# Patient Record
Sex: Female | Born: 1990 | Race: Black or African American | Hispanic: No | Marital: Single | Smoking: Former smoker
Health system: Southern US, Community
[De-identification: ages and names within clinical notes are randomized; demographics above are authoritative.]

## PROBLEM LIST (undated history)

## (undated) ENCOUNTER — Inpatient Hospital Stay (HOSPITAL_COMMUNITY): Payer: Self-pay

## (undated) DIAGNOSIS — Z789 Other specified health status: Secondary | ICD-10-CM

## (undated) HISTORY — PX: NO PAST SURGERIES: SHX2092

---

## 2012-06-30 ENCOUNTER — Encounter (HOSPITAL_COMMUNITY): Payer: Self-pay | Admitting: Emergency Medicine

## 2012-06-30 ENCOUNTER — Emergency Department (HOSPITAL_COMMUNITY)
Admission: EM | Admit: 2012-06-30 | Discharge: 2012-06-30 | Disposition: A | Discharge status: Home / Self Care | Payer: PRIVATE HEALTH INSURANCE | Attending: Emergency Medicine | Admitting: Emergency Medicine

## 2012-06-30 ENCOUNTER — Emergency Department (HOSPITAL_COMMUNITY): Payer: PRIVATE HEALTH INSURANCE

## 2012-06-30 DIAGNOSIS — Z3202 Encounter for pregnancy test, result negative: Secondary | ICD-10-CM | POA: Insufficient documentation

## 2012-06-30 DIAGNOSIS — R112 Nausea with vomiting, unspecified: Secondary | ICD-10-CM | POA: Insufficient documentation

## 2012-06-30 DIAGNOSIS — R42 Dizziness and giddiness: Secondary | ICD-10-CM | POA: Insufficient documentation

## 2012-06-30 DIAGNOSIS — F17200 Nicotine dependence, unspecified, uncomplicated: Secondary | ICD-10-CM | POA: Insufficient documentation

## 2012-06-30 LAB — COMPREHENSIVE METABOLIC PANEL
ALT: 7 U/L (ref 0–35)
Calcium: 10 mg/dL (ref 8.4–10.5)
Creatinine, Ser: 0.7 mg/dL (ref 0.50–1.10)
GFR calc Af Amer: >90 mL/min (ref >90)
GFR calc non Af Amer: >90 mL/min (ref >90)
Glucose, Bld: 106 mg/dL — ABNORMAL HIGH (ref 70–99)
Sodium: 138 mEq/L (ref 135–145)
Total Protein: 8.7 g/dL — ABNORMAL HIGH (ref 6.0–8.3)

## 2012-06-30 LAB — CBC WITH DIFFERENTIAL/PLATELET
Basophils Absolute: 0 10*3/uL (ref 0.0–0.1)
Eosinophils Absolute: 0 10*3/uL (ref 0.0–0.7)
Eosinophils Relative: 0 % (ref 0–5)
HCT: 40.8 % (ref 36.0–46.0)
MCH: 28.8 pg (ref 26.0–34.0)
MCV: 80 fL (ref 78.0–100.0)
Monocytes Absolute: 1 10*3/uL (ref 0.1–1.0)
Platelets: 367 10*3/uL (ref 150–400)
RDW: 14.7 % (ref 11.5–15.5)

## 2012-06-30 LAB — URINALYSIS, MICROSCOPIC ONLY
Nitrite: NEGATIVE
Specific Gravity, Urine: 1.035 — ABNORMAL HIGH (ref 1.005–1.030)
pH: 6 (ref 5.0–8.0)

## 2012-06-30 LAB — POCT PREGNANCY, URINE: Preg Test, Ur: NEGATIVE

## 2012-06-30 MED ORDER — METOCLOPRAMIDE HCL 5 MG/ML IJ SOLN
10.0000 mg | Freq: Once | INTRAMUSCULAR | Status: AC
  Administered 2012-06-30: 10 mg via INTRAVENOUS
  Filled 2012-06-30: qty 2

## 2012-06-30 MED ORDER — ONDANSETRON HCL 4 MG/2ML IJ SOLN
4.0000 mg | Freq: Once | INTRAMUSCULAR | Status: AC
  Administered 2012-06-30: 4 mg via INTRAVENOUS
  Filled 2012-06-30: qty 2

## 2012-06-30 MED ORDER — SODIUM CHLORIDE 0.9 % IV BOLUS (SEPSIS)
1000.0000 mL | Freq: Once | INTRAVENOUS | Status: AC
  Administered 2012-06-30: 1000 mL via INTRAVENOUS

## 2012-06-30 MED ORDER — METOCLOPRAMIDE HCL 10 MG PO TABS: 10.0000 mg | ORAL_TABLET | Freq: Four times a day (QID) | ORAL | Status: DC

## 2012-06-30 MED ORDER — ONDANSETRON 8 MG PO TBDP: 8.0000 mg | ORAL_TABLET | Freq: Three times a day (TID) | ORAL | Status: DC | PRN

## 2012-06-30 NOTE — ED Notes (Signed)
Pt tolerated fluids well.  

## 2012-06-30 NOTE — ED Provider Notes (Signed)
History     CSN: [id number]  Arrival date & time 06/30/12  0941   First MD Initiated Contact with Patient 06/30/12 [phone]      Chief Complaint  Patient presents with  . Nausea  . Emesis    (Consider location/radiation/quality/duration/timing/severity/associated sxs/prior treatment) HPI Sue Mendoza is a 22 y.o. female who presents to ED with complaint of nausea, vomiting for 3 days. Pt states unable to keep any fluids or solids down. States no chest pain or abdominal pain. No diarrhea. Had normal bowel movement two days ago. No urinary or vaginal symptoms. States had her period about 2 wks ago. States usually irregular and has similar symptoms with her cycle but not currently menstruating. States tried Chief Executive Officer and acid reflux pill over the counter with no improvement. Denies hematemesis or blood per rectum. Denies fever. States feels weak and dizzy when stands up. No medical problems. No hx of abdominal surgeries.  History reviewed. No pertinent past medical history.  History reviewed. No pertinent past surgical history.  No family history on file.  History  Substance Use Topics  . Smoking status: Current Every Day Smoker  . Smokeless tobacco: Not on file  . Alcohol Use: Yes     Comment: rare    OB History   Grav Para Term Preterm Abortions TAB SAB Ect Mult Living                  Review of Systems  Constitutional: Negative for fever and chills.  HENT: Negative for neck pain and neck stiffness.   Respiratory: Negative.   Cardiovascular: Negative.   Gastrointestinal: Positive for nausea and vomiting. Negative for abdominal pain, diarrhea, constipation and blood in stool.  Genitourinary: Positive for decreased urine volume. Negative for dysuria, urgency, frequency, flank pain, vaginal bleeding, vaginal discharge, difficulty urinating, vaginal pain and pelvic pain.  Musculoskeletal: Negative for myalgias.  Skin: Negative.   Allergic/Immunologic: Negative for  immunocompromised state.  Neurological: Positive for dizziness and light-headedness. Negative for headaches.  Psychiatric/Behavioral: Negative.     Allergies  Review of patient's allergies indicates no known allergies.  Home Medications  No current outpatient prescriptions on file.  BP 117/91  Pulse 68  Temp(Src) 98.5 F (36.9 C) (Oral)  Resp 18  SpO2 99%  LMP 06/05/2012  Physical Exam  Nursing note and vitals reviewed. Constitutional: She is oriented to person, place, and time. She appears well-developed and well-nourished. No distress.  HENT:  Head: Normocephalic.  Eyes: Conjunctivae are normal.  Neck: Neck supple.  Cardiovascular: Normal rate, regular rhythm and normal heart sounds.   Pulmonary/Chest: Effort normal and breath sounds normal. No respiratory distress. She has no wheezes. She has no rales.  Abdominal: Soft. Bowel sounds are normal. She exhibits no distension. There is no tenderness. There is no rebound.  Musculoskeletal: She exhibits no edema.  Neurological: She is oriented to person, place, and time.  Skin: Skin is warm.    ED Course  Procedures (including critical care time)  Results for orders placed during the hospital encounter of 06/30/12  URINALYSIS, MICROSCOPIC ONLY      Result Value Range   Color, Urine AMBER (*) YELLOW   APPearance CLEAR  CLEAR   Specific Gravity, Urine 1.035 (*) 1.005 - 1.030   pH 6.0  5.0 - 8.0   Glucose, UA NEGATIVE  NEGATIVE mg/dL   Hgb urine dipstick MODERATE (*) NEGATIVE   Bilirubin Urine SMALL (*) NEGATIVE   Ketones, ur 15 (*) NEGATIVE mg/dL   Protein,  ur 30 (*) NEGATIVE mg/dL   Urobilinogen, UA 1.0  0.0 - 1.0 mg/dL   Nitrite NEGATIVE  NEGATIVE   Leukocytes, UA SMALL (*) NEGATIVE   WBC, UA 3-6  <3 WBC/hpf   RBC / HPF 7-10  <3 RBC/hpf   Bacteria, UA FEW (*) RARE   Squamous Epithelial / LPF FEW (*) RARE   Urine-Other MUCOUS PRESENT    CBC WITH DIFFERENTIAL      Result Value Range   WBC 12.8 (*) 4.0 - 10.5 K/uL    RBC 5.10  3.87 - 5.11 MIL/uL   Hemoglobin 14.7  12.0 - 15.0 g/dL   HCT 16.1  09.6 - 04.5 %   MCV 80.0  78.0 - 100.0 fL   MCH 28.8  26.0 - 34.0 pg   MCHC 36.0  30.0 - 36.0 g/dL   RDW 40.9  81.1 - 91.4 %   Platelets 367  150 - 400 K/uL   Neutrophils Relative 67  43 - 77 %   Neutro Abs 8.6 (*) 1.7 - 7.7 K/uL   Lymphocytes Relative 24  12 - 46 %   Lymphs Abs 3.1  0.7 - 4.0 K/uL   Monocytes Relative 8  3 - 12 %   Monocytes Absolute 1.0  0.1 - 1.0 K/uL   Eosinophils Relative 0  0 - 5 %   Eosinophils Absolute 0.0  0.0 - 0.7 K/uL   Basophils Relative 0  0 - 1 %   Basophils Absolute 0.0  0.0 - 0.1 K/uL  COMPREHENSIVE METABOLIC PANEL      Result Value Range   Sodium 138  135 - 145 mEq/L   Potassium 3.4 (*) 3.5 - 5.1 mEq/L   Chloride 97  96 - 112 mEq/L   CO2 25  19 - 32 mEq/L   Glucose, Bld 106 (*) 70 - 99 mg/dL   BUN 19  6 - 23 mg/dL   Creatinine, Ser 7.82  0.50 - 1.10 mg/dL   Calcium 95.6  8.4 - 21.3 mg/dL   Total Protein 8.7 (*) 6.0 - 8.3 g/dL   Albumin 4.8  3.5 - 5.2 g/dL   AST 14  0 - 37 U/L   ALT 7  0 - 35 U/L   Alkaline Phosphatase 88  39 - 117 U/L   Total Bilirubin 0.6  0.3 - 1.2 mg/dL   GFR calc non Af Amer >90  >90 mL/min   GFR calc Af Amer >90  >90 mL/min  LIPASE, BLOOD      Result Value Range   Lipase 66 (*) 11 - 59 U/L  POCT PREGNANCY, URINE      Result Value Range   Preg Test, Ur NEGATIVE  NEGATIVE   US Abdomen Complete  06/30/2012  *RADIOLOGY REPORT*  Clinical Data:  22 year old female with abdominal pain, nausea and vomiting.  ABDOMINAL ULTRASOUND COMPLETE  Comparison:  None  Findings:  Gallbladder:  The gallbladder is unremarkable. There is no evidence of gallstones, gallbladder wall thickening, or pericholecystic fluid.  Common Bile Duct:  There is no evidence of intrahepatic or extrahepatic biliary dilation. The CBD measures 2.7 mm in greatest diameter.  Liver:  The liver is within normal limits in parenchymal echogenicity. No focal abnormalities are identified.   IVC:  Appears normal.  Pancreas:  Although the pancreas is difficult to visualize in its entirety, no focal pancreatic abnormality is identified.  Spleen:  Within normal limits in size and echotexture.  Right kidney:  The right kidney is  normal in size and parenchymal echogenicity.  There is no evidence of solid mass, hydronephrosis or definite renal calculi.  The right kidney measures 10.2 cm.  Left kidney:  The left kidney is normal in size and parenchymal echogenicity.  There is no evidence of solid mass, hydronephrosis or definite renal calculi.   The left kidney measures 10.4 cm.  Abdominal Aorta:  No abdominal aortic aneurysm identified.  There is no evidence of ascites.  IMPRESSION: Negative abdominal ultrasound.   Original Report Authenticated By: Harmon Pier, M.D.       1. Nausea & vomiting       MDM  Pt with nausea and vomiting for  3 days. No abdominal pain or tenderness. No other complaints. Work up here unremarkable other than WBC of 12.8. Pt rehydrated with IVF. Zofran and reglan given for nausea with good improvement. Her Abdominal US is negative. She is tolerating PO fluids. Abdomen reassessed. Non tender. No surgical abdomen. Plan to d/c home with zofran and PO reglan and follow up as needed.   Filed Vitals:   06/30/12 1050 06/30/12 1051 06/30/12 1053 06/30/12 1230  BP: 127/86 118/79 119/83 118/78  Pulse: 52 59 85 48  Temp:      TempSrc:      Resp:    19  SpO2:    99%         Yanique Mulvihill A Granvel Proudfoot, PA-C 06/30/12 1517

## 2012-06-30 NOTE — ED Notes (Signed)
Patient transported to Ultrasound 

## 2012-06-30 NOTE — ED Notes (Signed)
Pt c/o N/V since Thursday. Pt reports usually happens with her period, but not currently on her period. Pt denies abdominal pain.

## 2012-06-30 NOTE — ED Provider Notes (Signed)
Medical screening examination/treatment/procedure(s) were performed by non-physician practitioner and as supervising physician I was immediately available for consultation/collaboration.  Harnoor Reta, MD 06/30/12 1644 

## 2012-07-01 LAB — URINE CULTURE

## 2012-08-03 ENCOUNTER — Emergency Department (HOSPITAL_COMMUNITY)
Admission: EM | Admit: 2012-08-03 | Discharge: 2012-08-03 | Disposition: A | Discharge status: Home / Self Care | Payer: No Typology Code available for payment source | Attending: Emergency Medicine | Admitting: Emergency Medicine

## 2012-08-03 ENCOUNTER — Encounter (HOSPITAL_COMMUNITY): Payer: Self-pay | Admitting: Emergency Medicine

## 2012-08-03 DIAGNOSIS — F17200 Nicotine dependence, unspecified, uncomplicated: Secondary | ICD-10-CM | POA: Insufficient documentation

## 2012-08-03 DIAGNOSIS — R1084 Generalized abdominal pain: Secondary | ICD-10-CM | POA: Insufficient documentation

## 2012-08-03 DIAGNOSIS — R112 Nausea with vomiting, unspecified: Secondary | ICD-10-CM | POA: Insufficient documentation

## 2012-08-03 DIAGNOSIS — Z3202 Encounter for pregnancy test, result negative: Secondary | ICD-10-CM | POA: Insufficient documentation

## 2012-08-03 LAB — URINALYSIS, ROUTINE W REFLEX MICROSCOPIC
Leukocytes, UA: NEGATIVE
Nitrite: NEGATIVE
Specific Gravity, Urine: 1.037 — ABNORMAL HIGH (ref 1.005–1.030)
pH: 6.5 (ref 5.0–8.0)

## 2012-08-03 LAB — POCT I-STAT, CHEM 8
Calcium, Ion: 1.11 mmol/L — ABNORMAL LOW (ref 1.12–1.23)
Chloride: 101 mEq/L (ref 96–112)
HCT: 42 % (ref 36.0–46.0)
Potassium: 3.1 mEq/L — ABNORMAL LOW (ref 3.5–5.1)

## 2012-08-03 LAB — CBC WITH DIFFERENTIAL/PLATELET
Basophils Absolute: 0 10*3/uL (ref 0.0–0.1)
Lymphocytes Relative: 18 % (ref 12–46)
Neutro Abs: 8.5 10*3/uL — ABNORMAL HIGH (ref 1.7–7.7)
Platelets: 337 10*3/uL (ref 150–400)
RDW: 14.8 % (ref 11.5–15.5)

## 2012-08-03 LAB — URINE MICROSCOPIC-ADD ON

## 2012-08-03 LAB — POCT PREGNANCY, URINE: Preg Test, Ur: NEGATIVE

## 2012-08-03 MED ORDER — ONDANSETRON HCL 4 MG/2ML IJ SOLN
4.0000 mg | Freq: Once | INTRAMUSCULAR | Status: AC
  Administered 2012-08-03: 4 mg via INTRAVENOUS
  Filled 2012-08-03: qty 2

## 2012-08-03 MED ORDER — ONDANSETRON HCL 4 MG PO TABS: 4.0000 mg | ORAL_TABLET | Freq: Three times a day (TID) | ORAL | Status: DC | PRN

## 2012-08-03 MED ORDER — SODIUM CHLORIDE 0.9 % IV BOLUS (SEPSIS)
1000.0000 mL | Freq: Once | INTRAVENOUS | Status: AC
  Administered 2012-08-03: 1000 mL via INTRAVENOUS

## 2012-08-03 MED ORDER — SODIUM CHLORIDE 0.9 % IV BOLUS (SEPSIS): 1000.0000 mL | Freq: Once | INTRAVENOUS | Status: DC

## 2012-08-03 NOTE — ED Notes (Signed)
Patient tolerated fluids without problems

## 2012-08-03 NOTE — ED Notes (Signed)
Up to restroom urine obtained

## 2012-08-03 NOTE — ED Notes (Signed)
Patient discharged using the teach back method she verbalized an understanding

## 2012-08-03 NOTE — ED Provider Notes (Signed)
History     CSN: [id number]  Arrival date & time 08/03/12  4010   First MD Initiated Contact with Patient 08/03/12 [phone]      Chief Complaint  Patient presents with  . Emesis    (Consider location/radiation/quality/duration/timing/severity/associated sxs/prior treatment) HPI Comments: Patient presents to the emergency department with chief complaint of nausea and vomiting x3 days. She states that she is felt like this before. She was seen about a month ago for the same. She is sure with Zofran and fluids, with good improvement. She believes this is associated with her menstrual cycle. She is currently menstruating. She also endorses some diffuse, crampy abdominal pain. She denies fevers, chills, hematemesis or blood in her stool. She states that the abdominal pain is 5/10. She has not taken anything to alleviate her symptoms. She denies dysuria and unusual vaginal discharge.  The history is provided by the patient. No language interpreter was used.    History reviewed. No pertinent past medical history.  History reviewed. No pertinent past surgical history.  History reviewed. No pertinent family history.  History  Substance Use Topics  . Smoking status: Current Every Day Smoker  . Smokeless tobacco: Not on file  . Alcohol Use: Yes     Comment: rare    OB History   Grav Para Term Preterm Abortions TAB SAB Ect Mult Living                  Review of Systems  All other systems reviewed and are negative.    Allergies  Review of patient's allergies indicates no known allergies.  Home Medications   Current Outpatient Rx  Name  Route  Sig  Dispense  Refill  . metoCLOPramide (REGLAN) 10 MG tablet   Oral   Take 10 mg by mouth every 6 (six) hours as needed (nausea and vomiting).           BP 119/77  Pulse 76  Temp(Src) 97.3 F (36.3 C) (Oral)  Resp 22  SpO2 99%  Physical Exam  Nursing note and vitals reviewed. Constitutional: She is oriented to person, place,  and time. She appears well-developed and well-nourished.  HENT:  Head: Normocephalic and atraumatic.  Eyes: Conjunctivae and EOM are normal. Pupils are equal, round, and reactive to light.  Neck: Normal range of motion. Neck supple.  Cardiovascular: Normal rate and regular rhythm.  Exam reveals no gallop and no friction rub.   No murmur heard. Pulmonary/Chest: Effort normal and breath sounds normal. No respiratory distress. She has no wheezes. She has no rales. She exhibits no tenderness.  Abdominal: Soft. Bowel sounds are normal. She exhibits no distension and no mass. There is no tenderness. There is no rebound and no guarding.  Diffuse, mild abdominal pain, without focal tenderness, no signs of acute or surgical abdomen  Musculoskeletal: Normal range of motion. She exhibits no edema and no tenderness.  Neurological: She is alert and oriented to person, place, and time.  Skin: Skin is warm and dry.  Psychiatric: She has a normal mood and affect. Her behavior is normal. Judgment and thought content normal.    ED Course  Procedures (including critical care time)  Labs Reviewed  CBC WITH DIFFERENTIAL  URINALYSIS, ROUTINE W REFLEX MICROSCOPIC   Results for orders placed during the hospital encounter of 08/03/12  CBC WITH DIFFERENTIAL      Result Value Range   WBC 11.2 (*) 4.0 - 10.5 K/uL   RBC 4.63  3.87 - 5.11 MIL/uL  Hemoglobin 13.8  12.0 - 15.0 g/dL   HCT 14.7  82.9 - 56.2 %   MCV 81.9  78.0 - 100.0 fL   MCH 29.8  26.0 - 34.0 pg   MCHC 36.4 (*) 30.0 - 36.0 g/dL   RDW 13.0  86.5 - 78.4 %   Platelets 337  150 - 400 K/uL   Neutrophils Relative % 76  43 - 77 %   Neutro Abs 8.5 (*) 1.7 - 7.7 K/uL   Lymphocytes Relative 18  12 - 46 %   Lymphs Abs 2.0  0.7 - 4.0 K/uL   Monocytes Relative 6  3 - 12 %   Monocytes Absolute 0.7  0.1 - 1.0 K/uL   Eosinophils Relative 0  0 - 5 %   Eosinophils Absolute 0.0  0.0 - 0.7 K/uL   Basophils Relative 0  0 - 1 %   Basophils Absolute 0.0  0.0 -  0.1 K/uL  URINALYSIS, ROUTINE W REFLEX MICROSCOPIC      Result Value Range   Color, Urine AMBER (*) YELLOW   APPearance CLOUDY (*) CLEAR   Specific Gravity, Urine 1.037 (*) 1.005 - 1.030   pH 6.5  5.0 - 8.0   Glucose, UA NEGATIVE  NEGATIVE mg/dL   Hgb urine dipstick LARGE (*) NEGATIVE   Bilirubin Urine SMALL (*) NEGATIVE   Ketones, ur >80 (*) NEGATIVE mg/dL   Protein, ur 696 (*) NEGATIVE mg/dL   Urobilinogen, UA 0.2  0.0 - 1.0 mg/dL   Nitrite NEGATIVE  NEGATIVE   Leukocytes, UA NEGATIVE  NEGATIVE  URINE MICROSCOPIC-ADD ON      Result Value Range   Squamous Epithelial / LPF RARE  RARE   RBC / HPF 21-50  <3 RBC/hpf   Bacteria, UA RARE  RARE   Urine-Other MUCOUS PRESENT    POCT I-STAT, CHEM 8      Result Value Range   Sodium 138  135 - 145 mEq/L   Potassium 3.1 (*) 3.5 - 5.1 mEq/L   Chloride 101  96 - 112 mEq/L   BUN 22  6 - 23 mg/dL   Creatinine, Ser 2.95  0.50 - 1.10 mg/dL   Glucose, Bld 89  70 - 99 mg/dL   Calcium, Ion 2.84 (*) 1.12 - 1.23 mmol/L   TCO2 22  0 - 100 mmol/L   Hemoglobin 14.3  12.0 - 15.0 g/dL   HCT 13.2  44.0 - 10.2 %  POCT PREGNANCY, URINE      Result Value Range   Preg Test, Ur NEGATIVE  NEGATIVE      1. Nausea and vomiting       MDM  Patient with three-day history of nausea and vomiting. She has been seen for this in the past. She believes is related to her menstrual cycle. She is currently on her cycle. Treat with zofran and fluids, will recheck.  12:54 PM Patient states that she is feeling better. No focal abdominal tenderness. No vaginal discharge or bleeding. Will fluid challenge the patient, and discharge with Zofran if she is able to tolerate oral intake.  1:44 PM Patient has tolerated PO.  She states that she feels well enough to go home.  Discussed with Dr. Anitra Lauth, who agrees with the plan.  Patient is stable and ready for discharge.        Roxy Horseman, PA-C 08/03/12 1345

## 2012-08-03 NOTE — ED Notes (Signed)
Pt c/o N/V x 3 days

## 2012-08-04 NOTE — ED Provider Notes (Signed)
Medical screening examination/treatment/procedure(s) were performed by non-physician practitioner and as supervising physician I was immediately available for consultation/collaboration.   Devion Chriscoe, MD 08/04/12 0749 

## 2013-01-31 ENCOUNTER — Emergency Department (HOSPITAL_COMMUNITY)
Admission: EM | Admit: 2013-01-31 | Discharge: 2013-01-31 | Disposition: A | Discharge status: Home / Self Care | Payer: PRIVATE HEALTH INSURANCE | Attending: Emergency Medicine | Admitting: Emergency Medicine

## 2013-01-31 ENCOUNTER — Encounter (HOSPITAL_COMMUNITY): Payer: Self-pay | Admitting: Emergency Medicine

## 2013-01-31 DIAGNOSIS — R55 Syncope and collapse: Secondary | ICD-10-CM | POA: Insufficient documentation

## 2013-01-31 DIAGNOSIS — F17200 Nicotine dependence, unspecified, uncomplicated: Secondary | ICD-10-CM | POA: Insufficient documentation

## 2013-01-31 DIAGNOSIS — Z3202 Encounter for pregnancy test, result negative: Secondary | ICD-10-CM | POA: Insufficient documentation

## 2013-01-31 DIAGNOSIS — E860 Dehydration: Secondary | ICD-10-CM

## 2013-01-31 DIAGNOSIS — N926 Irregular menstruation, unspecified: Secondary | ICD-10-CM

## 2013-01-31 LAB — CBC WITH DIFFERENTIAL/PLATELET
Basophils Absolute: 0 10*3/uL (ref 0.0–0.1)
Basophils Relative: 0 % (ref 0–1)
Eosinophils Absolute: 0 10*3/uL (ref 0.0–0.7)
HCT: 38.6 % (ref 36.0–46.0)
MCH: 31.3 pg (ref 26.0–34.0)
MCHC: 36.8 g/dL — ABNORMAL HIGH (ref 30.0–36.0)
Monocytes Absolute: 1.2 10*3/uL — ABNORMAL HIGH (ref 0.1–1.0)
Monocytes Relative: 9 % (ref 3–12)
Neutro Abs: 10.8 10*3/uL — ABNORMAL HIGH (ref 1.7–7.7)
RDW: 13.9 % (ref 11.5–15.5)

## 2013-01-31 LAB — COMPREHENSIVE METABOLIC PANEL
Albumin: 4.8 g/dL (ref 3.5–5.2)
Alkaline Phosphatase: 68 U/L (ref 39–117)
BUN: 20 mg/dL (ref 6–23)
Calcium: 9.8 mg/dL (ref 8.4–10.5)
Creatinine, Ser: 0.65 mg/dL (ref 0.50–1.10)
GFR calc Af Amer: >90 mL/min (ref >90)
Glucose, Bld: 113 mg/dL — ABNORMAL HIGH (ref 70–99)
Potassium: 3.6 mEq/L (ref 3.5–5.1)
Total Protein: 8.4 g/dL — ABNORMAL HIGH (ref 6.0–8.3)

## 2013-01-31 LAB — URINALYSIS, ROUTINE W REFLEX MICROSCOPIC
Glucose, UA: NEGATIVE mg/dL
Ketones, ur: >80 mg/dL — AB
Nitrite: NEGATIVE
Specific Gravity, Urine: 1.034 — ABNORMAL HIGH (ref 1.005–1.030)
pH: 6 (ref 5.0–8.0)

## 2013-01-31 LAB — URINE MICROSCOPIC-ADD ON

## 2013-01-31 LAB — PREGNANCY, URINE: Preg Test, Ur: NEGATIVE

## 2013-01-31 MED ORDER — MORPHINE SULFATE 4 MG/ML IJ SOLN
4.0000 mg | Freq: Once | INTRAMUSCULAR | Status: DC
  Filled 2013-01-31: qty 1

## 2013-01-31 MED ORDER — SODIUM CHLORIDE 0.9 % IV SOLN: INTRAVENOUS | Status: DC

## 2013-01-31 MED ORDER — ONDANSETRON HCL 4 MG/2ML IJ SOLN
4.0000 mg | Freq: Once | INTRAMUSCULAR | Status: AC
  Administered 2013-01-31: 4 mg via INTRAVENOUS
  Filled 2013-01-31: qty 2

## 2013-01-31 MED ORDER — ONDANSETRON HCL 8 MG PO TABS: 8.0000 mg | ORAL_TABLET | Freq: Three times a day (TID) | ORAL | Status: DC | PRN

## 2013-01-31 MED ORDER — SODIUM CHLORIDE 0.9 % IV BOLUS (SEPSIS)
1000.0000 mL | Freq: Once | INTRAVENOUS | Status: AC
  Administered 2013-01-31: 1000 mL via INTRAVENOUS

## 2013-01-31 NOTE — ED Notes (Signed)
Spoke with IV nurse, Donnie will be down to insert heplock.

## 2013-01-31 NOTE — ED Notes (Signed)
Initiated fluid challenge-pt tolerating ginger ale well.

## 2013-01-31 NOTE — ED Provider Notes (Signed)
CSN: [id number]     Arrival date & time 01/31/13  5621 History   First MD Initiated Contact with Patient 01/31/13 [phone]     Chief Complaint  Patient presents with  . Nausea  . Near Syncope   (Consider location/radiation/quality/duration/timing/severity/associated sxs/prior Treatment) HPI Comments: Sue Mendoza is a 22 y.o. female who presents for evaluation of vomiting. She's been vomiting for several days since she started her menses. This is typical for her with menses. She has irregular periods for several years. She previously been evaluated for this and referred to GYN for possible endometriosis. She has not yet seen GYN. She denies abdominal pain, fever, diarrhea, dysuria, or urinary frequency, or back pain. She tried Aleve, yesterday. She continues to have vomiting today. No other known modifying factors.   The history is provided by the patient and a friend.    History reviewed. No pertinent past medical history. History reviewed. No pertinent past surgical history. No family history on file. History  Substance Use Topics  . Smoking status: Current Every Day Smoker  . Smokeless tobacco: Not on file  . Alcohol Use: Yes     Comment: rare   OB History   Grav Para Term Preterm Abortions TAB SAB Ect Mult Living                 Review of Systems  All other systems reviewed and are negative.    Allergies  Review of patient's allergies indicates no known allergies.  Home Medications   Current Outpatient Rx  Name  Route  Sig  Dispense  Refill  . naproxen sodium (ANAPROX) 220 MG tablet   Oral   Take 440 mg by mouth daily as needed (for pain).         . ondansetron (ZOFRAN) 8 MG tablet   Oral   Take 1 tablet (8 mg total) by mouth every 8 (eight) hours as needed for nausea or vomiting.   20 tablet   0    BP 129/74  Pulse 112  Temp(Src) 97.5 F (36.4 C) (Oral)  Resp 14  Ht 5\' 2"  (1.575 m)  SpO2 100%  LMP 01/31/2013 Physical Exam  Nursing note and vitals  reviewed. Constitutional: She is oriented to person, place, and time. She appears well-developed and well-nourished.  HENT:  Head: Normocephalic and atraumatic.  Eyes: Conjunctivae and EOM are normal. Pupils are equal, round, and reactive to light.  Neck: Normal range of motion and phonation normal. Neck supple.  Cardiovascular: Normal rate, regular rhythm and intact distal pulses.   Pulmonary/Chest: Effort normal and breath sounds normal. She exhibits no tenderness.  Abdominal: Soft. She exhibits no distension. There is tenderness (mild, diffuse.). There is no rebound and no guarding.  Musculoskeletal: Normal range of motion.  Neurological: She is alert and oriented to person, place, and time. She exhibits normal muscle tone.  Skin: Skin is warm and dry.  Psychiatric: She has a normal mood and affect. Her behavior is normal. Judgment and thought content normal.    ED Course  Procedures (including critical care time)  Medications  sodium chloride 0.9 % bolus 1,000 mL (0 mLs Intravenous Stopped 01/31/13 1003)  ondansetron (ZOFRAN) injection 4 mg (4 mg Intravenous Given 01/31/13 0850)    Patient Vitals for the past 24 hrs:  BP Temp Temp src Pulse Resp SpO2 Height  01/31/13 1100 129/74 mmHg - - 112 - 100 % -  01/31/13 1007 125/74 mmHg 97.5 F (36.4 C) Oral 56 14 96 % -  01/31/13 0900 118/60 mmHg - - 52 - 97 % -  01/31/13 0800 112/83 mmHg - - 53 - 100 % -  01/31/13 0705 124/69 mmHg 97.7 F (36.5 C) Oral 56 22 99 % 5\' 2"  (1.575 m)     11:20 AM Reevaluation with update and discussion. After initial assessment and treatment, an updated evaluation reveals she is feeling better. She has tolerated 12 ounces of fluid without vomiting. She is hemodynamically stable. Ayham Word L      Labs Review Labs Reviewed  CBC WITH DIFFERENTIAL - Abnormal; Notable for the following:    WBC 13.6 (*)    MCHC 36.8 (*)    Neutrophils Relative % 79 (*)    Neutro Abs 10.8 (*)    Monocytes Absolute  1.2 (*)    All other components within normal limits  COMPREHENSIVE METABOLIC PANEL - Abnormal; Notable for the following:    Glucose, Bld 113 (*)    Total Protein 8.4 (*)    All other components within normal limits  URINALYSIS, ROUTINE W REFLEX MICROSCOPIC - Abnormal; Notable for the following:    APPearance CLOUDY (*)    Specific Gravity, Urine 1.034 (*)    Hgb urine dipstick LARGE (*)    Bilirubin Urine SMALL (*)    Ketones, ur >80 (*)    Protein, ur 100 (*)    Leukocytes, UA SMALL (*)    All other components within normal limits  URINE CULTURE  LIPASE, BLOOD  PREGNANCY, URINE  URINE MICROSCOPIC-ADD ON   Imaging Review No results found.  EKG Interpretation   None       MDM   1. Dehydration   2. Irregular menses      Nonspecific recurrent vomiting, associated with menses spacing, consideration and concern for endometriosis. She is hemodynamically stable. She indicates mild dehydration with normal renal function. She is improved with treatment in the ED, in stable for discharge with followup as an outpatient.   Nursing Notes Reviewed/ Care Coordinated, and agree without changes. Applicable Imaging Reviewed.  Interpretation of Laboratory Data incorporated into ED treatment   Plan: Home Medications- Zofran; Home Treatments and Observation- gradually advance diet. Push fluids; return here if the recommended treatment, does not improve the symptoms; Recommended follow up- GYN Followup to be evaluated for endometriosis     Flint Melter, MD 01/31/13 1710

## 2013-01-31 NOTE — ED Notes (Addendum)
Per pt, she has been vomiting since yesterday and had a near syncopal episode this morning.

## 2013-01-31 NOTE — ED Notes (Signed)
Pt. Does not want her morphine at present time

## 2013-01-31 NOTE — ED Notes (Signed)
Pt states feeling of nausea returning

## 2013-02-02 LAB — URINE CULTURE: Colony Count: 40000

## 2013-02-06 ENCOUNTER — Encounter (HOSPITAL_COMMUNITY): Payer: Self-pay | Admitting: Emergency Medicine

## 2013-02-06 ENCOUNTER — Emergency Department (HOSPITAL_COMMUNITY)
Admission: EM | Admit: 2013-02-06 | Discharge: 2013-02-07 | Disposition: A | Discharge status: Home / Self Care | Payer: No Typology Code available for payment source | Attending: Emergency Medicine | Admitting: Emergency Medicine

## 2013-02-06 DIAGNOSIS — Z3202 Encounter for pregnancy test, result negative: Secondary | ICD-10-CM | POA: Insufficient documentation

## 2013-02-06 DIAGNOSIS — E860 Dehydration: Secondary | ICD-10-CM | POA: Insufficient documentation

## 2013-02-06 DIAGNOSIS — F17200 Nicotine dependence, unspecified, uncomplicated: Secondary | ICD-10-CM | POA: Insufficient documentation

## 2013-02-06 DIAGNOSIS — R109 Unspecified abdominal pain: Secondary | ICD-10-CM | POA: Insufficient documentation

## 2013-02-06 DIAGNOSIS — R42 Dizziness and giddiness: Secondary | ICD-10-CM | POA: Insufficient documentation

## 2013-02-06 DIAGNOSIS — R5381 Other malaise: Secondary | ICD-10-CM | POA: Insufficient documentation

## 2013-02-06 DIAGNOSIS — R112 Nausea with vomiting, unspecified: Secondary | ICD-10-CM

## 2013-02-06 LAB — COMPREHENSIVE METABOLIC PANEL
ALT: 8 U/L (ref 0–35)
AST: 17 U/L (ref 0–37)
Albumin: 4.8 g/dL (ref 3.5–5.2)
Calcium: 9.8 mg/dL (ref 8.4–10.5)
Potassium: 3.6 mEq/L (ref 3.5–5.1)
Sodium: 133 mEq/L — ABNORMAL LOW (ref 135–145)
Total Protein: 8.4 g/dL — ABNORMAL HIGH (ref 6.0–8.3)

## 2013-02-06 LAB — CBC WITH DIFFERENTIAL/PLATELET
Basophils Absolute: 0 10*3/uL (ref 0.0–0.1)
Eosinophils Absolute: 0.1 10*3/uL (ref 0.0–0.7)
Eosinophils Relative: 0 % (ref 0–5)
MCH: 31.6 pg (ref 26.0–34.0)
MCV: 85.7 fL (ref 78.0–100.0)
Neutrophils Relative %: 74 % (ref 43–77)
Platelets: 401 10*3/uL — ABNORMAL HIGH (ref 150–400)
RDW: 13.2 % (ref 11.5–15.5)
WBC: 15.2 10*3/uL — ABNORMAL HIGH (ref 4.0–10.5)

## 2013-02-06 LAB — RAPID URINE DRUG SCREEN, HOSP PERFORMED
Amphetamines: NOT DETECTED
Barbiturates: NOT DETECTED
Benzodiazepines: NOT DETECTED
Cocaine: NOT DETECTED
Tetrahydrocannabinol: POSITIVE — AB

## 2013-02-06 LAB — URINALYSIS, ROUTINE W REFLEX MICROSCOPIC
Hgb urine dipstick: NEGATIVE
Nitrite: NEGATIVE
Protein, ur: NEGATIVE mg/dL
Specific Gravity, Urine: 1.014 (ref 1.005–1.030)
Urobilinogen, UA: 1 mg/dL (ref 0.0–1.0)

## 2013-02-06 MED ORDER — SODIUM CHLORIDE 0.9 % IV BOLUS (SEPSIS)
1000.0000 mL | Freq: Once | INTRAVENOUS | Status: AC
  Administered 2013-02-06: 1000 mL via INTRAVENOUS

## 2013-02-06 MED ORDER — METOCLOPRAMIDE HCL 5 MG/ML IJ SOLN
10.0000 mg | Freq: Once | INTRAMUSCULAR | Status: AC
  Administered 2013-02-06: 10 mg via INTRAVENOUS
  Filled 2013-02-06: qty 2

## 2013-02-06 MED ORDER — SODIUM CHLORIDE 0.9 % IV BOLUS (SEPSIS): 1000.0000 mL | Freq: Once | INTRAVENOUS | Status: DC

## 2013-02-06 MED ORDER — ONDANSETRON HCL 4 MG/2ML IJ SOLN
4.0000 mg | Freq: Once | INTRAMUSCULAR | Status: AC
  Administered 2013-02-06: 4 mg via INTRAVENOUS
  Filled 2013-02-06: qty 2

## 2013-02-06 MED ORDER — MORPHINE SULFATE 4 MG/ML IJ SOLN
2.0000 mg | Freq: Once | INTRAMUSCULAR | Status: AC
  Administered 2013-02-06: 2 mg via INTRAVENOUS
  Filled 2013-02-06: qty 1

## 2013-02-06 NOTE — ED Provider Notes (Signed)
CSN: [id number]     Arrival date & time 02/06/13  1930 History   First MD Initiated Contact with Patient 02/06/13 2020     Chief Complaint  Patient presents with  . Emesis   (Consider location/radiation/quality/duration/timing/severity/associated sxs/prior Treatment) HPI Sue Mendoza is a 22 y.o. female who presents emergency department complaining of nausea, vomiting. States that she began having symptoms a week ago when she started her menstrual cycle. States she has had symptoms similar to this in the past always with her cycle. States she was seen here in emergency department with her symptoms began, had negative workup including ultrasound, was sent home and Zofran. States she has been taking Zofran at home, which has helped slightly with her nausea, however she continued not being able to tolerate any fluids or solids. Patient states "I have not had eating to either drink in a week." Patient reports feeling weak, dizzy, unable to walk. Patient does not have any primary care doctor in this area. Patient is originally from [place]. Patient denies any recent travel or surgeries. Patient denies any prior abdominal surgeries. Patient states today is her last day of her cycle. Pt states she has lost 60lbs in the last year unintentionally due to this. Pt denies any diarrhea. No urinary or vaginal symptoms.   History reviewed. No pertinent past medical history. History reviewed. No pertinent past surgical history. No family history on file. History  Substance Use Topics  . Smoking status: Current Every Day Smoker  . Smokeless tobacco: Not on file  . Alcohol Use: Yes     Comment: rare   OB History   Grav Para Term Preterm Abortions TAB SAB Ect Mult Living                 Review of Systems  Constitutional: Positive for fatigue and unexpected weight change. Negative for fever and chills.  Respiratory: Negative for cough, chest tightness and shortness of breath.   Cardiovascular: Negative  for chest pain, palpitations and leg swelling.  Gastrointestinal: Positive for nausea, vomiting and abdominal pain. Negative for diarrhea, constipation and blood in stool.  Genitourinary: Negative for dysuria, flank pain, vaginal bleeding, vaginal discharge, vaginal pain and pelvic pain.  Musculoskeletal: Negative for arthralgias, myalgias, neck pain and neck stiffness.  Skin: Negative for rash.  Neurological: Positive for dizziness, weakness and light-headedness. Negative for headaches.  All other systems reviewed and are negative.    Allergies  Review of patient's allergies indicates no known allergies.  Home Medications   Current Outpatient Rx  Name  Route  Sig  Dispense  Refill  . ondansetron (ZOFRAN) 8 MG tablet   Oral   Take 1 tablet (8 mg total) by mouth every 8 (eight) hours as needed for nausea or vomiting.   20 tablet   0    BP 146/91  Pulse 84  Temp(Src) 97.6 F (36.4 C) (Oral)  Resp 16  Ht 5\' 3"  (1.6 m)  Wt 109 lb (49.442 kg)  BMI 19.31 kg/m2  SpO2 100%  LMP 01/31/2013 Physical Exam  Nursing note and vitals reviewed. Constitutional: She appears well-developed and well-nourished.  Appears uncomfortable and ill  HENT:  Head: Normocephalic.  Eyes: Conjunctivae are normal.  Neck: Neck supple.  Cardiovascular: Normal rate, regular rhythm and normal heart sounds.   Pulmonary/Chest: Effort normal and breath sounds normal. No respiratory distress. She has no wheezes. She has no rales.  Abdominal: Soft. Bowel sounds are normal. She exhibits no distension. There is no tenderness. There  is no rebound and no guarding.  No CVA tenderness  Musculoskeletal: She exhibits no edema.  Neurological: She is alert.  Skin: Skin is warm and dry.  Psychiatric: She has a normal mood and affect. Her behavior is normal.    ED Course  Procedures (including critical care time) Labs Review Labs Reviewed  URINALYSIS, ROUTINE W REFLEX MICROSCOPIC - Abnormal; Notable for the  following:    Ketones, ur 40 (*)    All other components within normal limits  CBC WITH DIFFERENTIAL - Abnormal; Notable for the following:    WBC 15.2 (*)    Hemoglobin 16.1 (*)    MCHC 36.9 (*)    Platelets 401 (*)    Neutro Abs 11.2 (*)    Monocytes Absolute 1.1 (*)    All other components within normal limits  COMPREHENSIVE METABOLIC PANEL - Abnormal; Notable for the following:    Sodium 133 (*)    Chloride 89 (*)    Glucose, Bld 115 (*)    Total Protein 8.4 (*)    All other components within normal limits  URINE RAPID DRUG SCREEN (HOSP PERFORMED) - Abnormal; Notable for the following:    Opiates POSITIVE (*)    Tetrahydrocannabinol POSITIVE (*)    All other components within normal limits  LIPASE, BLOOD  CG4 I-STAT (LACTIC ACID)  POCT PREGNANCY, URINE   Imaging Review No results found.  EKG Interpretation   None       MDM   1. Nausea & vomiting   2. Dehydration    Patient with persistent nausea and vomiting. History of the same. States menstrual related. She is actively vomiting on initial evaluation. She appears very dehydrated. Labs ordered. Will rehydrate and try some anti-emetics.  11:59 PM Pt feeling much better. She received 3L of NS, zofran, reglan. She is now tolerating PO fluids. Orthostatics obtained and are normal. Patient's urine drug screen did show positive Cannabis.   It is possible that she has can be induced vomiting syndrome. Pt wants to go home. She will follow up outpatient. Will try reglan and phenergan at home. No relief currently with PO zofran.   Filed Vitals:   02/06/13 2342 02/06/13 2345 02/06/13 2346 02/06/13 2347  BP: 120/70 120/70 127/70 120/79  Pulse:  71 83 100  Temp: 97.7 F (36.5 C)     TempSrc: Oral     Resp: 20     Height:      Weight:      SpO2: 100%         Sue Mendoza A Maninder Deboer, PA-C 02/07/13 0408

## 2013-02-06 NOTE — ED Notes (Signed)
The patient is unable to give urine specimen at this time. The patient has been advised to use call light for restroom assistance. The tech has reported the RN in charge.

## 2013-02-06 NOTE — ED Notes (Signed)
Pt. reports persistent nausea / vomitting for several days unrelieved by prescription antiemetic , seen here last Friday with the same complaints discharge home with prescription .

## 2013-02-06 NOTE — ED Notes (Signed)
Pt given sprite to drink for fluid challenge 

## 2013-02-06 NOTE — ED Notes (Signed)
cg4 shown to [employer] pa

## 2013-02-06 NOTE — ED Notes (Signed)
Nausea continues 

## 2013-02-06 NOTE — ED Notes (Signed)
Pt was seen here last week for same, was given Zofran, reports it did not help much.  Father concerned that pt moved here from [place] this year and weighed 170lb, now weighs 104.  Unintentional weight loss.

## 2013-02-07 MED ORDER — PROMETHAZINE HCL 12.5 MG PO TABS: 12.5000 mg | ORAL_TABLET | ORAL | Status: DC | PRN

## 2013-02-07 MED ORDER — METOCLOPRAMIDE HCL 10 MG PO TABS: 10.0000 mg | ORAL_TABLET | Freq: Four times a day (QID) | ORAL | Status: DC

## 2013-02-09 NOTE — ED Provider Notes (Signed)
Medical screening examination/treatment/procedure(s) were performed by non-physician practitioner and as supervising physician I was immediately available for consultation/collaboration.  EKG Interpretation   None         Gwyneth Sprout, MD 02/09/13 1626

## 2014-12-10 ENCOUNTER — Emergency Department (HOSPITAL_COMMUNITY)
Admission: EM | Admit: 2014-12-10 | Discharge: 2014-12-11 | Disposition: A | Discharge status: Home / Self Care | Payer: Self-pay | Attending: Emergency Medicine | Admitting: Emergency Medicine

## 2014-12-10 DIAGNOSIS — Z720 Tobacco use: Secondary | ICD-10-CM | POA: Insufficient documentation

## 2014-12-10 DIAGNOSIS — Z3202 Encounter for pregnancy test, result negative: Secondary | ICD-10-CM | POA: Insufficient documentation

## 2014-12-10 DIAGNOSIS — Z792 Long term (current) use of antibiotics: Secondary | ICD-10-CM | POA: Insufficient documentation

## 2014-12-10 DIAGNOSIS — R102 Pelvic and perineal pain: Secondary | ICD-10-CM

## 2014-12-10 LAB — CBC WITH DIFFERENTIAL/PLATELET
Basophils Absolute: 0 10*3/uL (ref 0.0–0.1)
Basophils Relative: 0 %
Eosinophils Absolute: 0 10*3/uL (ref 0.0–0.7)
Eosinophils Relative: 0 %
HEMATOCRIT: 40 % (ref 36.0–46.0)
HEMOGLOBIN: 13.7 g/dL (ref 12.0–15.0)
LYMPHS ABS: 0.8 10*3/uL (ref 0.7–4.0)
LYMPHS PCT: 5 %
MCH: 29.8 pg (ref 26.0–34.0)
MCHC: 34.3 g/dL (ref 30.0–36.0)
MCV: 87.1 fL (ref 78.0–100.0)
MONO ABS: 0.5 10*3/uL (ref 0.1–1.0)
MONOS PCT: 3 %
NEUTROS ABS: 14.4 10*3/uL — AB (ref 1.7–7.7)
NEUTROS PCT: 92 %
Platelets: 339 10*3/uL (ref 150–400)
RBC: 4.59 MIL/uL (ref 3.87–5.11)
RDW: 14.9 % (ref 11.5–15.5)
WBC: 15.7 10*3/uL — ABNORMAL HIGH (ref 4.0–10.5)

## 2014-12-10 LAB — COMPREHENSIVE METABOLIC PANEL
ALK PHOS: 73 U/L (ref 38–126)
ALT: 11 U/L — ABNORMAL LOW (ref 14–54)
ANION GAP: 16 — AB (ref 5–15)
AST: 30 U/L (ref 15–41)
Albumin: 4.6 g/dL (ref 3.5–5.0)
BUN: 12 mg/dL (ref 6–20)
CO2: 18 mmol/L — ABNORMAL LOW (ref 22–32)
Calcium: 9.5 mg/dL (ref 8.9–10.3)
Chloride: 101 mmol/L (ref 101–111)
Creatinine, Ser: 0.84 mg/dL (ref 0.44–1.00)
GFR calc Af Amer: >60 mL/min (ref >60)
GLUCOSE: 128 mg/dL — AB (ref 65–99)
POTASSIUM: 4.1 mmol/L (ref 3.5–5.1)
Sodium: 135 mmol/L (ref 135–145)
TOTAL PROTEIN: 8.3 g/dL — AB (ref 6.5–8.1)
Total Bilirubin: 0.7 mg/dL (ref 0.3–1.2)

## 2014-12-10 LAB — URINE MICROSCOPIC-ADD ON

## 2014-12-10 LAB — URINALYSIS, ROUTINE W REFLEX MICROSCOPIC
Bilirubin Urine: NEGATIVE
Glucose, UA: NEGATIVE mg/dL
Ketones, ur: >80 mg/dL — AB
Nitrite: NEGATIVE
Protein, ur: 100 mg/dL — AB
Specific Gravity, Urine: 1.026 (ref 1.005–1.030)
Urobilinogen, UA: 0.2 mg/dL (ref 0.0–1.0)
pH: 8 (ref 5.0–8.0)

## 2014-12-10 LAB — I-STAT CG4 LACTIC ACID, ED: Lactic Acid, Venous: 3.09 mmol/L (ref 0.5–2.0)

## 2014-12-10 LAB — POC URINE PREG, ED: Preg Test, Ur: NEGATIVE

## 2014-12-10 MED ORDER — SODIUM CHLORIDE 0.9 % IV BOLUS (SEPSIS)
1000.0000 mL | Freq: Once | INTRAVENOUS | Status: AC
  Administered 2014-12-10: 1000 mL via INTRAVENOUS

## 2014-12-10 MED ORDER — ONDANSETRON HCL 4 MG/2ML IJ SOLN
4.0000 mg | Freq: Once | INTRAMUSCULAR | Status: AC
  Administered 2014-12-10: 4 mg via INTRAVENOUS
  Filled 2014-12-10: qty 2

## 2014-12-10 MED ORDER — ONDANSETRON 4 MG PO TBDP
4.0000 mg | ORAL_TABLET | Freq: Once | ORAL | Status: AC
  Administered 2014-12-10: 4 mg via ORAL

## 2014-12-10 MED ORDER — ONDANSETRON 4 MG PO TBDP
ORAL_TABLET | ORAL | Status: AC
  Filled 2014-12-10: qty 1

## 2014-12-10 MED ORDER — IOHEXOL 300 MG/ML  SOLN
25.0000 mL | INTRAMUSCULAR | Status: AC
  Administered 2014-12-10: 25 mL via ORAL

## 2014-12-10 NOTE — ED Notes (Signed)
Re-checked Vitals per [employer]omatic Services operational officer.

## 2014-12-10 NOTE — ED Notes (Signed)
Pt threw up contrast

## 2014-12-10 NOTE — ED Notes (Signed)
Pt c/o nausea and vomiting onset last pm  St's can't keep anything down. Pt denies any diarrhea

## 2014-12-10 NOTE — ED Provider Notes (Signed)
CSN: [id number]     Arrival date & time 12/10/14  1806 History   First MD Initiated Contact with Patient 12/10/14 2051     Chief Complaint  Patient presents with  . Emesis     (Consider location/radiation/quality/duration/timing/severity/associated sxs/prior Treatment) Patient is a 24 y.o. female presenting with vomiting and abdominal pain.  Emesis Severity:  Moderate Duration:  2 days Quality:  Stomach contents Associated symptoms: abdominal pain   Associated symptoms: no chills and no diarrhea   Abdominal Pain Pain location:  Generalized Pain quality: sharp   Pain radiates to:  Does not radiate Pain severity:  Moderate Onset quality:  Gradual Duration:  2 days Timing:  Constant Progression:  Unchanged Chronicity:  New Context comment:  Spontaneous Relieved by:  Nothing Worsened by:  Nothing tried Associated symptoms: anorexia, nausea and vomiting   Associated symptoms: no chills, no diarrhea, no fever, no vaginal bleeding and no vaginal discharge     History reviewed. No pertinent past medical history. No past surgical history on file. No family history on file. Social History  Substance Use Topics  . Smoking status: Current Every Day Smoker  . Smokeless tobacco: None  . Alcohol Use: Yes     Comment: rare   OB History    No data available     Review of Systems  Constitutional: Negative for fever and chills.  Gastrointestinal: Positive for nausea, vomiting, abdominal pain and anorexia. Negative for diarrhea.  Genitourinary: Negative for vaginal bleeding and vaginal discharge.  All other systems reviewed and are negative.     Allergies  Review of patient's allergies indicates no known allergies.  Home Medications   Prior to Admission medications   Medication Sig Start Date End Date Taking? Authorizing Provider  doxycycline (VIBRAMYCIN) 100 MG capsule Take 1 capsule (100 mg total) by mouth 2 (two) times daily. 12/11/14   Derwood Kaplan, MD  ibuprofen  (ADVIL,MOTRIN) 400 MG tablet Take 1 tablet (400 mg total) by mouth every 6 (six) hours as needed. 12/11/14   Derwood Kaplan, MD  metoCLOPramide (REGLAN) 10 MG tablet Take 1 tablet (10 mg total) by mouth every 6 (six) hours. Patient not taking: Reported on 12/10/2014 02/07/13   Tatyana Kirichenko, PA-C  ondansetron (ZOFRAN) 8 MG tablet Take 1 tablet (8 mg total) by mouth every 8 (eight) hours as needed for nausea or vomiting. Patient not taking: Reported on 12/10/2014 01/31/13   Mancel Bale, MD  promethazine (PHENERGAN) 12.5 MG tablet Take 1 tablet (12.5 mg total) by mouth every 4 (four) hours as needed for nausea or vomiting. Patient not taking: Reported on 12/10/2014 02/07/13   Tatyana Kirichenko, PA-C   BP 114/55 mmHg  Pulse 63  Temp(Src) 97.9 F (36.6 C) (Oral)  Resp 16  SpO2 100%  LMP 11/26/2014 Physical Exam  Constitutional: She is oriented to person, place, and time. She appears well-developed and well-nourished.  HENT:  Head: Normocephalic and atraumatic.  Right Ear: External ear normal.  Left Ear: External ear normal.  Eyes: Conjunctivae and EOM are normal. Pupils are equal, round, and reactive to light.  Neck: Normal range of motion. Neck supple.  Cardiovascular: Normal rate, regular rhythm, normal heart sounds and intact distal pulses.   Pulmonary/Chest: Effort normal and breath sounds normal.  Abdominal: Soft. Bowel sounds are normal. There is generalized tenderness.  Musculoskeletal: Normal range of motion.  Neurological: She is alert and oriented to person, place, and time.  Skin: Skin is warm and dry.  Vitals reviewed.   ED  Course  Procedures (including critical care time) Labs Review Labs Reviewed  WET PREP, GENITAL - Abnormal; Notable for the following:    Clue Cells Wet Prep HPF POC MODERATE (*)    WBC, Wet Prep HPF POC MANY (*)    All other components within normal limits  CBC WITH DIFFERENTIAL/PLATELET - Abnormal; Notable for the following:    WBC 15.7 (*)     Neutro Abs 14.4 (*)    All other components within normal limits  COMPREHENSIVE METABOLIC PANEL - Abnormal; Notable for the following:    CO2 18 (*)    Glucose, Bld 128 (*)    Total Protein 8.3 (*)    ALT 11 (*)    Anion gap 16 (*)    All other components within normal limits  URINALYSIS, ROUTINE W REFLEX MICROSCOPIC (NOT AT [facility]) - Abnormal; Notable for the following:    APPearance CLOUDY (*)    Hgb urine dipstick SMALL (*)    Ketones, ur >80 (*)    Protein, ur 100 (*)    Leukocytes, UA SMALL (*)    All other components within normal limits  URINE MICROSCOPIC-ADD ON - Abnormal; Notable for the following:    Bacteria, UA FEW (*)    All other components within normal limits  I-STAT CG4 LACTIC ACID, ED - Abnormal; Notable for the following:    Lactic Acid, Venous 3.09 (*)    All other components within normal limits  I-STAT CG4 LACTIC ACID, ED - Abnormal; Notable for the following:    Lactic Acid, Venous 2.29 (*)    All other components within normal limits  GC/CHLAMYDIA PROBE AMP () NOT AT [facility] - Abnormal; Notable for the following:    Neisseria gonorrhea **POSITIVE** (*)    All other components within normal limits  RAPID HIV SCREEN (HIV 1/2 AB+AG)  POC URINE PREG, ED    Imaging Review US Transvaginal Non-ob  12/11/2014   CLINICAL DATA:  Acute onset of generalized abdominal pain and vomiting. Initial encounter.  EXAM: TRANSABDOMINAL AND TRANSVAGINAL ULTRASOUND OF PELVIS  TECHNIQUE: Both transabdominal and transvaginal ultrasound examinations of the pelvis were performed. Transabdominal technique was performed for global imaging of the pelvis including uterus, ovaries, adnexal regions, and pelvic cul-de-sac. It was necessary to proceed with endovaginal exam following the transabdominal exam to visualize the uterus and ovaries in greater detail.  COMPARISON:  CT of the abdomen and pelvis performed earlier today at 2:15 a.m.  FINDINGS: Uterus  Measurements: 7.7 x 3.9 x 4.9  cm. No fibroids or other mass visualized.  Endometrium  Thickness: 1.1 cm.  No focal abnormality visualized.  Right ovary  Measurements: 2.9 x 1.6 x 2.4 cm. Normal appearance/no adnexal mass.  Left ovary  Measurements: 2.2 x 0.8 x 1.1 cm. Normal appearance/no adnexal mass.  Other findings  No free fluid is seen within the pelvic cul-de-sac.  IMPRESSION: Unremarkable pelvic ultrasound. No evidence for ovarian torsion. Bowel noted adjacent to both ovaries.   Electronically Signed   By: Roanna Raider M.D.   On: 12/11/2014 06:02   US Pelvis Complete  12/11/2014   CLINICAL DATA:  Acute onset of generalized abdominal pain and vomiting. Initial encounter.  EXAM: TRANSABDOMINAL AND TRANSVAGINAL ULTRASOUND OF PELVIS  TECHNIQUE: Both transabdominal and transvaginal ultrasound examinations of the pelvis were performed. Transabdominal technique was performed for global imaging of the pelvis including uterus, ovaries, adnexal regions, and pelvic cul-de-sac. It was necessary to proceed with endovaginal exam following the transabdominal exam to  visualize the uterus and ovaries in greater detail.  COMPARISON:  CT of the abdomen and pelvis performed earlier today at 2:15 a.m.  FINDINGS: Uterus  Measurements: 7.7 x 3.9 x 4.9 cm. No fibroids or other mass visualized.  Endometrium  Thickness: 1.1 cm.  No focal abnormality visualized.  Right ovary  Measurements: 2.9 x 1.6 x 2.4 cm. Normal appearance/no adnexal mass.  Left ovary  Measurements: 2.2 x 0.8 x 1.1 cm. Normal appearance/no adnexal mass.  Other findings  No free fluid is seen within the pelvic cul-de-sac.  IMPRESSION: Unremarkable pelvic ultrasound. No evidence for ovarian torsion. Bowel noted adjacent to both ovaries.   Electronically Signed   By: Roanna Raider M.D.   On: 12/11/2014 06:02   Ct Abdomen Pelvis W Contrast  12/11/2014   CLINICAL DATA:  Lower abdominal pain, nausea and vomiting.  EXAM: CT ABDOMEN AND PELVIS WITH CONTRAST  TECHNIQUE: Multidetector CT imaging  of the abdomen and pelvis was performed using the standard protocol following bolus administration of intravenous contrast.  CONTRAST:  80mL OMNIPAQUE IOHEXOL 300 MG/ML  SOLN  COMPARISON:  None.  FINDINGS: Lower chest:  No significant abnormality  Hepatobiliary: There are normal appearances of the liver, gallbladder and bile ducts.  Pancreas: Normal  Spleen: Normal  Adrenals/Urinary Tract: Insert negative g  Stomach/Bowel: There is a small hiatal hernia. Stomach and small bowel are otherwise normal. Colon is unremarkable. The appendix is normal.  Vascular/Lymphatic: The abdominal aorta is normal in caliber. There is no atherosclerotic calcification. There is no adenopathy in the abdomen or pelvis.  Other: There is prominent endometrial thickening, perhaps related to the patient's menstrual cycle.  There is fluid in the right adnexal region and there is a peripherally enhancing partially collapsed 2 cm right ovarian cyst. The right adnexal region has a complex appearance, with mixed soft tissue and fluid attenuation and with no tissue planes separating the cecal tip from the right adnexal fluid. This could represent tubo-ovarian abscess. The entire appendix is visible from its tip to its base, with thin wall and air in the lumen, so this should not be an appendiceal abscess.  Musculoskeletal: No significant musculoskeletal lesions.  IMPRESSION: 1. Complex appearances of the right adnexal region with fluid and irregular soft tissues adjacent to the cecal tip and around the ovary. There is obliteration of the tissue planes between the cecal tip in the right adnexal structures. Normal appendix. This could represent tubo-ovarian abscess. Consider pelvic sonography for additional evaluation. 2. Small hiatal hernia.   Electronically Signed   By: Ellery Plunk M.D.   On: 12/11/2014 02:38   I have personally reviewed and evaluated these images and lab results as part of my medical decision-making.   EKG  Interpretation None      MDM   Final diagnoses:  Pelvic pain in female    24 y.o. female without pertinent PMHpresents with generalized abd pain.  No vaginal bleeding, dc or other pelvic findings.  Physical exam with generalized tenderness, ? If worse in RLQ.  CT abd ordered.  Pt care to Dr. Rhunette Croft pending results.    I have reviewed all laboratory and imaging studies if ordered as above  1. Pelvic pain in female         Mirian Mo, MD 12/12/14 [phone]

## 2014-12-11 ENCOUNTER — Emergency Department (HOSPITAL_COMMUNITY): Payer: Self-pay

## 2014-12-11 ENCOUNTER — Encounter (HOSPITAL_COMMUNITY): Payer: Self-pay

## 2014-12-11 LAB — I-STAT CG4 LACTIC ACID, ED: Lactic Acid, Venous: 2.29 mmol/L (ref 0.5–2.0)

## 2014-12-11 LAB — RAPID HIV SCREEN (HIV 1/2 AB+AG)
HIV 1/2 Antibodies: NONREACTIVE
HIV-1 P24 Antigen - HIV24: NONREACTIVE

## 2014-12-11 LAB — WET PREP, GENITAL
TRICH WET PREP: NONE SEEN
YEAST WET PREP: NONE SEEN

## 2014-12-11 LAB — GC/CHLAMYDIA PROBE AMP (~~LOC~~) NOT AT ARMC
CHLAMYDIA, DNA PROBE: NEGATIVE
NEISSERIA GONORRHEA: POSITIVE — AB

## 2014-12-11 MED ORDER — DOXYCYCLINE HYCLATE 100 MG PO CAPS: 100.0000 mg | ORAL_CAPSULE | Freq: Two times a day (BID) | ORAL | Status: DC

## 2014-12-11 MED ORDER — AZITHROMYCIN 250 MG PO TABS
1000.0000 mg | ORAL_TABLET | Freq: Once | ORAL | Status: AC
  Administered 2014-12-11: 1000 mg via ORAL
  Filled 2014-12-11: qty 4

## 2014-12-11 MED ORDER — LIDOCAINE HCL (PF) 1 % IJ SOLN
INTRAMUSCULAR | Status: AC
  Administered 2014-12-11: 0.9 mL
  Filled 2014-12-11: qty 5

## 2014-12-11 MED ORDER — IOHEXOL 300 MG/ML  SOLN
80.0000 mL | Freq: Once | INTRAMUSCULAR | Status: AC | PRN
  Administered 2014-12-11: 80 mL via INTRAVENOUS

## 2014-12-11 MED ORDER — IBUPROFEN 400 MG PO TABS: 400.0000 mg | ORAL_TABLET | Freq: Four times a day (QID) | ORAL | Status: DC | PRN

## 2014-12-11 MED ORDER — CEFTRIAXONE SODIUM 250 MG IJ SOLR
250.0000 mg | Freq: Once | INTRAMUSCULAR | Status: AC
  Administered 2014-12-11: 250 mg via INTRAMUSCULAR
  Filled 2014-12-11: qty 250

## 2014-12-11 MED ORDER — ONDANSETRON HCL 4 MG/2ML IJ SOLN
4.0000 mg | Freq: Once | INTRAMUSCULAR | Status: AC
  Administered 2014-12-11: 4 mg via INTRAVENOUS
  Filled 2014-12-11: qty 2

## 2014-12-11 NOTE — ED Provider Notes (Addendum)
  Physical Exam  BP 100/75 mmHg  Pulse 52  Resp 18  SpO2 99%  LMP 11/26/2014  Physical Exam  ED Course  Procedures  MDM  Pt with diffuse abd pain. She has leukocytosis and lactic acidosis. Also ketones in the urine. She is healthy. CT ordered. If CT is neg. PO challenge, and tx for UTI.    Derwood Kaplan, MD 12/11/14 0003  CT shows concerns for TOA. Pelvic exam not very impressive, so [place] ordered. Pt does endorse unprotected intercourse and is not sure about the partner, and she would prefer being covered for GC and Chlamydia and also get HIV screen/ She has been advised to see health dept. [place] pending.  Derwood Kaplan, MD 12/11/14 [phone]

## 2014-12-11 NOTE — ED Notes (Signed)
Pelvic cart set up at bedside  

## 2014-12-11 NOTE — Discharge Instructions (Signed)
Take the meds prescribed.   Please return to the ER if your symptoms worsen; you have increased pain, fevers, chills, inability to keep any medications down, confusion. Otherwise see the outpatient doctor as requested.  RESOURCE GUIDE  Chronic Pain Problems: Contact [facility]  [phone] Patients need to be referred by their primary care doctor.  Insufficient Money for Medicine: Contact United Way:  call "211."   No Primary Care Doctor: - Call Health Connect  [phone] - can help you locate a primary care doctor that  accepts your insurance, provides certain services, etc. - Physician Referral Service- [phone]  Agencies that provide inexpensive medical care: - [facility]  [phone] - [facility]  [phone] - [facility]  [phone] - Women's Clinic  [phone] - Planned Parenthood  [phone] [facility]  [phone]  Medicaid-accepting [facility] Providers: - [facility]- [address]  [phone], Mon-Fri 9am-7pm, Sat 9am-1pm - [facility] [address] [place], [address] [place]  [phone] - [facility]- [address] [place]  [phone] [facility]- [address]  [phone] - Renaye Rakers- [address] [place], [address], [phone]  Only accepts [place] patients after they have their name  applied to their card  Self Pay (no insurance) in [place]: - Sickle Cell Patients: Dr Willey Blade, [facility]  [address] [place], [phone] - [facility]- [address] [place]  [phone]       [facility]- [address]       -     [facility]- see information above (Speak to [employer] if you do not have insurance)       -  [facility]- [address],  [phone]       -  [facility]- [address] [phone]       -  Dr Julio Sicks-  [address], [place], [phone]       -  [facility] - [address], [phone]       -  [facility]- [address], [phone], also [address] [phone]       -    [facility]- [address] [place], [phone], 1st & 3rd Saturday        every month, 10am-1pm  [facility] [address] [place], [place] [address] [phone]  [facility] [address] [place], [place] [address] [phone]  1) Find a Doctor and Pay Out of Pocket Although you won't have to find out who is covered by your insurance plan, it is a good idea to ask around and get recommendations. You will then need to call the office and see if the doctor you have chosen will accept you as a new patient and what types of options they offer for patients who are self-pay. Some doctors offer discounts or will set up payment plans for their patients who do not have insurance, but you will need to ask so you aren't surprised when you get to your appointment.  2) Contact Your Local Health Department Not all health departments have doctors that can see patients for sick visits, but  many do, so it is worth a call to see if yours does. If you don't know where your local health department is, you can check in your phone book. The CDC also has a tool to help you locate your state's health department, and many state websites also have listings of all of their local health departments.  3) Find a Walk-in Clinic If your illness is not likely to be very severe or complicated, you may want to try a walk in clinic. These are popping up all over the country in pharmacies, drugstores, and shopping centers. They're usually staffed by nurse practitioners or physician assistants that have been trained to treat common illnesses and complaints. They're usually fairly quick and inexpensive.  However, if you have serious medical issues or chronic medical problems, these are probably not your best option  STD Testing - [facility] Department of [facility], STD Clinic, [address] [place], phone [phone] or [phone].  Monday - Friday, call for an appointment. [facility] Department of [employer], STD Clinic, [place] [address], [place], phone [phone] or [phone].  Monday - Friday, call for an appointment.  Abuse/Neglect: [facility] Child Abuse Hotline 925-756-6918 [facility] Child Abuse Hotline 782-506-5625 (After Hours)  Emergency Shelter:  [facility] 6030800947  Maternity Homes: - [facility] [place] [facility] [phone] - [facility] [phone]  MRSA Hotline #:   939 452 6103  Dental Assistance If unable to pay or uninsured, contact:  [facility]. to become qualified for the adult dental clinic.  Patients with Medicaid: [facility] [phone] [address], [phone] [address] [phone]  If unable to pay, or uninsured, contact [facility] [phone] in [place], [phone] in [facility]) to become qualified for the adult dental clinic  [facility] [address] [place], [place] [address] [phone] [email or web address]  Other Proofreader Services: - [facility]- [address] [place], [place], [place], [address], [phone], Ext. [phone], 2nd and 4th Thursday of the month at 6:30am.  10 clients each day by appointment, can sometimes see walk-in patients if someone does not show for an appointment. [facility]- [address] [place], [place], [address], [phone] - [facility]- [address] [place], [place], [address], [phone] [facility] Health Department- [phone] [facility] Health Department- [phone] [facility] [phone]           Pelvic Pain, Female Female pelvic pain can be caused by many different things and start from a variety of places. Pelvic pain refers to pain that is located in the lower half of the abdomen and between your hips. The pain may occur over a short period of time (acute) or may be reoccurring (chronic). The cause of pelvic pain may be related to disorders affecting the female reproductive organs (gynecologic), but it may also be related to the bladder, kidney stones, an intestinal complication, or muscle or skeletal problems. Getting help right away for pelvic pain is important, especially if there has been severe, sharp, or a sudden onset of unusual pain. It is also important to get help right away because some types of pelvic pain can be life threatening.  CAUSES  Below are only some of the causes of pelvic pain. The causes of pelvic pain can be in one of several categories.   Gynecologic.  Pelvic inflammatory disease.  Sexually transmitted infection.  Ovarian cyst or a twisted ovarian ligament (ovarian torsion).  Uterine lining that grows outside the uterus (endometriosis).  Fibroids, cysts, or tumors.  Ovulation.  Pregnancy.  Pregnancy that occurs outside the uterus (ectopic pregnancy).  Miscarriage.  Labor.  Abruption of the placenta or ruptured uterus.  Infection.  Uterine infection (endometritis).  Bladder infection.  Diverticulitis.  Miscarriage related to a uterine infection (septic abortion).  Bladder.  Inflammation of the bladder (cystitis).  Kidney stone(s).  Gastrointestinal.  Constipation.  Diverticulitis.  Neurologic.  Trauma.  Feeling pelvic pain because of mental or emotional causes (psychosomatic).  Cancers of the bowel or pelvis. EVALUATION  Your caregiver will want to take a careful history of your concerns. This includes recent changes in your health, a careful gynecologic history of your  periods (menses), and a sexual history. Obtaining your family history and medical history is also important. Your caregiver may suggest a pelvic exam. A pelvic exam will help identify the location and severity of the pain. It also helps in the evaluation of which organ system may be involved. In order to identify the cause of the pelvic pain and be properly treated, your caregiver may order tests. These tests may include:   A pregnancy test.  Pelvic ultrasonography.  An X-ray exam of the abdomen.  A urinalysis or evaluation of vaginal discharge.  Blood tests. HOME CARE INSTRUCTIONS   Only take over-the-counter or prescription medicines for pain, discomfort, or fever as directed by your caregiver.   Rest as directed by your caregiver.   Eat a balanced diet.   Drink enough fluids to make your urine clear or pale yellow, or as directed.   Avoid sexual intercourse if it causes pain.   Apply warm or cold compresses to the lower abdomen depending on which one helps the pain.   Avoid stressful situations.   Keep a journal of your pelvic pain. Write down when it started, where the pain is located, and if there are things that seem to be associated with the pain, such as food or your menstrual cycle.  Follow up with your caregiver as directed.  SEEK MEDICAL CARE IF:  Your medicine does not help your pain.  You have abnormal vaginal discharge. SEEK IMMEDIATE MEDICAL CARE IF:   You have heavy bleeding from the vagina.   Your pelvic pain increases.   You feel light-headed or faint.   You have chills.   You have pain with urination or blood in your urine.   You have uncontrolled diarrhea or vomiting.   You have a fever or persistent symptoms for more than 3 days.  You have a fever and your symptoms suddenly get worse.   You are being physically or sexually abused.   This information is not intended to replace advice given to you by your health care provider.  Make sure you discuss any questions you have with your health care provider.   Document Released: 01/18/2004 Document Revised: 11/11/2014 Document Reviewed: 06/12/2011 Elsevier Interactive Patient Education [employer]

## 2014-12-11 NOTE — ED Notes (Signed)
Patient transported to Ultrasound 

## 2014-12-11 NOTE — ED Notes (Signed)
Patient transported to CT 

## 2014-12-13 ENCOUNTER — Telehealth: Payer: Self-pay | Admitting: *Deleted

## 2014-12-13 NOTE — ED Notes (Signed)
Phone call from patient requesting test results, no results to discuss.

## 2014-12-14 ENCOUNTER — Telehealth (HOSPITAL_BASED_OUTPATIENT_CLINIC_OR_DEPARTMENT_OTHER): Payer: Self-pay | Admitting: Emergency Medicine

## 2016-07-19 ENCOUNTER — Encounter (HOSPITAL_COMMUNITY): Payer: Self-pay | Admitting: *Deleted

## 2016-07-19 ENCOUNTER — Inpatient Hospital Stay (HOSPITAL_COMMUNITY)
Admission: AD | Admit: 2016-07-19 | Discharge: 2016-07-19 | Disposition: A | Discharge status: Home / Self Care | Payer: Medicaid Other | Source: Ambulatory Visit | Attending: Obstetrics and Gynecology | Admitting: Obstetrics and Gynecology

## 2016-07-19 ENCOUNTER — Inpatient Hospital Stay (HOSPITAL_COMMUNITY): Payer: Medicaid Other

## 2016-07-19 DIAGNOSIS — Z79899 Other long term (current) drug therapy: Secondary | ICD-10-CM | POA: Diagnosis not present

## 2016-07-19 DIAGNOSIS — Z3A01 Less than 8 weeks gestation of pregnancy: Secondary | ICD-10-CM | POA: Diagnosis not present

## 2016-07-19 DIAGNOSIS — R109 Unspecified abdominal pain: Secondary | ICD-10-CM | POA: Diagnosis not present

## 2016-07-19 DIAGNOSIS — F17200 Nicotine dependence, unspecified, uncomplicated: Secondary | ICD-10-CM | POA: Diagnosis not present

## 2016-07-19 DIAGNOSIS — O26899 Other specified pregnancy related conditions, unspecified trimester: Secondary | ICD-10-CM

## 2016-07-19 DIAGNOSIS — O26892 Other specified pregnancy related conditions, second trimester: Secondary | ICD-10-CM | POA: Diagnosis not present

## 2016-07-19 DIAGNOSIS — Z6790 Unspecified blood type, Rh positive: Secondary | ICD-10-CM

## 2016-07-19 DIAGNOSIS — O209 Hemorrhage in early pregnancy, unspecified: Secondary | ICD-10-CM | POA: Diagnosis not present

## 2016-07-19 DIAGNOSIS — O210 Mild hyperemesis gravidarum: Secondary | ICD-10-CM

## 2016-07-19 DIAGNOSIS — O99332 Smoking (tobacco) complicating pregnancy, second trimester: Secondary | ICD-10-CM | POA: Insufficient documentation

## 2016-07-19 DIAGNOSIS — O4690 Antepartum hemorrhage, unspecified, unspecified trimester: Secondary | ICD-10-CM

## 2016-07-19 DIAGNOSIS — O3680X0 Pregnancy with inconclusive fetal viability, not applicable or unspecified: Secondary | ICD-10-CM

## 2016-07-19 LAB — URINALYSIS, ROUTINE W REFLEX MICROSCOPIC
BILIRUBIN URINE: NEGATIVE
Bacteria, UA: NONE SEEN
Glucose, UA: NEGATIVE mg/dL
Ketones, ur: NEGATIVE mg/dL
Leukocytes, UA: NEGATIVE
NITRITE: NEGATIVE
PROTEIN: NEGATIVE mg/dL
Specific Gravity, Urine: 1.028 (ref 1.005–1.030)
pH: 6 (ref 5.0–8.0)

## 2016-07-19 LAB — CBC
HEMATOCRIT: 39.2 % (ref 36.0–46.0)
Hemoglobin: 13.6 g/dL (ref 12.0–15.0)
MCH: 31 pg (ref 26.0–34.0)
MCHC: 34.7 g/dL (ref 30.0–36.0)
MCV: 89.3 fL (ref 78.0–100.0)
PLATELETS: 319 10*3/uL (ref 150–400)
RBC: 4.39 MIL/uL (ref 3.87–5.11)
RDW: 14.9 % (ref 11.5–15.5)
WBC: 9.2 10*3/uL (ref 4.0–10.5)

## 2016-07-19 LAB — COMPREHENSIVE METABOLIC PANEL
ALK PHOS: 64 U/L (ref 38–126)
ALT: 9 U/L — AB (ref 14–54)
AST: 15 U/L (ref 15–41)
Albumin: 4.7 g/dL (ref 3.5–5.0)
Anion gap: 8 (ref 5–15)
BUN: 15 mg/dL (ref 6–20)
CHLORIDE: 104 mmol/L (ref 101–111)
CO2: 23 mmol/L (ref 22–32)
CREATININE: 0.55 mg/dL (ref 0.44–1.00)
Calcium: 8.8 mg/dL — ABNORMAL LOW (ref 8.9–10.3)
GFR calc non Af Amer: >60 mL/min (ref >60)
GLUCOSE: 88 mg/dL (ref 65–99)
Potassium: 3.6 mmol/L (ref 3.5–5.1)
Sodium: 135 mmol/L (ref 135–145)
Total Bilirubin: 0.9 mg/dL (ref 0.3–1.2)
Total Protein: 8.2 g/dL — ABNORMAL HIGH (ref 6.5–8.1)

## 2016-07-19 LAB — OB RESULTS CONSOLE HEPATITIS B SURFACE ANTIGEN: Hepatitis B Surface Ag: NEGATIVE

## 2016-07-19 LAB — ABO/RH: ABO/RH(D): O POS

## 2016-07-19 LAB — WET PREP, GENITAL
CLUE CELLS WET PREP: NONE SEEN
Sperm: NONE SEEN
Trich, Wet Prep: NONE SEEN

## 2016-07-19 LAB — POCT PREGNANCY, URINE: Preg Test, Ur: POSITIVE — AB

## 2016-07-19 LAB — OB RESULTS CONSOLE ANTIBODY SCREEN: Antibody Screen: NEGATIVE

## 2016-07-19 LAB — OB RESULTS CONSOLE RUBELLA ANTIBODY, IGM: RUBELLA: IMMUNE

## 2016-07-19 LAB — HCG, QUANTITATIVE, PREGNANCY: HCG, BETA CHAIN, QUANT, S: 2187 m[IU]/mL — AB (ref <5)

## 2016-07-19 LAB — OB RESULTS CONSOLE GC/CHLAMYDIA: GC PROBE AMP, GENITAL: NEGATIVE

## 2016-07-19 NOTE — MAU Provider Note (Signed)
History     CSN: [id number]  Arrival date and time: 07/19/16 1509   First Provider Initiated Contact with Patient 07/19/16 1751      Chief Complaint  Patient presents with  . Nausea  . Emesis  . Possible Pregnancy  . Abdominal Pain   G1 @ unknown gestation here with VB, nausea, emesis, and lightheadedness. Sx started about 3 days ago. Having 1-2 episodes of emesis per day. No syncope. VB was only spotting and lasted 3 days then stopped. No vaginal discharge or itching. Also reports intermittent quick sharp pains in lower abdomen x2 days. Pain last just a dew seconds. Has not tried anything for the pain. Pt has no idea when last menses occurred.     OB History    Gravida Para Term Preterm AB Living   1             SAB TAB Ectopic Multiple Live Births                  History reviewed. No pertinent past medical history.  History reviewed. No pertinent surgical history.  No family history on file.  Social History  Substance Use Topics  . Smoking status: Current Every Day Smoker  . Smokeless tobacco: Never Used  . Alcohol use Yes     Comment: rare    Allergies: No Known Allergies  Prescriptions Prior to Admission  Medication Sig Dispense Refill Last Dose  . doxycycline (VIBRAMYCIN) 100 MG capsule Take 1 capsule (100 mg total) by mouth 2 (two) times daily. 28 capsule 0   . ibuprofen (ADVIL,MOTRIN) 400 MG tablet Take 1 tablet (400 mg total) by mouth every 6 (six) hours as needed. 30 tablet 0   . metoCLOPramide (REGLAN) 10 MG tablet Take 1 tablet (10 mg total) by mouth every 6 (six) hours. (Patient not taking: Reported on 12/10/2014) 30 tablet 0 Not Taking at Unknown time  . ondansetron (ZOFRAN) 8 MG tablet Take 1 tablet (8 mg total) by mouth every 8 (eight) hours as needed for nausea or vomiting. (Patient not taking: Reported on 12/10/2014) 20 tablet 0 Not Taking at Unknown time  . promethazine (PHENERGAN) 12.5 MG tablet Take 1 tablet (12.5 mg total) by mouth every 4 (four)  hours as needed for nausea or vomiting. (Patient not taking: Reported on 12/10/2014) 20 tablet 0 Not Taking at Unknown time    Review of Systems  Gastrointestinal: Positive for abdominal pain, nausea and vomiting.  Genitourinary: Positive for vaginal bleeding.  Neurological: Positive for light-headedness.   Physical Exam   Blood pressure 128/69, pulse 70, temperature 98.8 F (37.1 C), resp. rate 16, weight 61.2 kg (135 lb), SpO2 99 %.  Physical Exam  Constitutional: She is oriented to person, place, and time. She appears well-developed and well-nourished. No distress.  HENT:  Head: Normocephalic and atraumatic.  Neck: Normal range of motion.  Cardiovascular: Normal rate.   Respiratory: Effort normal.  GI: Soft. She exhibits no distension and no mass. There is no tenderness. There is no rebound.  Genitourinary:  Genitourinary Comments: External: no lesions or erythema Vagina: rugated, nulli, thin white discharge, no blood Uterus: non enlarged, anteverted, non tender, no CMT Adnexae: no masses, no tenderness left, no tenderness right   Musculoskeletal: Normal range of motion.  Neurological: She is alert and oriented to person, place, and time.  Skin: Skin is warm and dry.  Psychiatric: She has a normal mood and affect.   Results for orders placed or performed during  the hospital encounter of 07/19/16 (from the past 24 hour(s))  Urinalysis, Routine w reflex microscopic     Status: Abnormal   Collection Time: 07/19/16  3:51 PM  Result Value Ref Range   Color, Urine YELLOW YELLOW   APPearance CLEAR CLEAR   Specific Gravity, Urine 1.028 1.005 - 1.030   pH 6.0 5.0 - 8.0   Glucose, UA NEGATIVE NEGATIVE mg/dL   Hgb urine dipstick SMALL (A) NEGATIVE   Bilirubin Urine NEGATIVE NEGATIVE   Ketones, ur NEGATIVE NEGATIVE mg/dL   Protein, ur NEGATIVE NEGATIVE mg/dL   Nitrite NEGATIVE NEGATIVE   Leukocytes, UA NEGATIVE NEGATIVE   RBC / HPF 6-30 0 - 5 RBC/hpf   WBC, UA 0-5 0 - 5 WBC/hpf    Bacteria, UA NONE SEEN NONE SEEN   Squamous Epithelial / LPF 0-5 (A) NONE SEEN   Mucous PRESENT   Pregnancy, urine POC     Status: Abnormal   Collection Time: 07/19/16  4:18 PM  Result Value Ref Range   Preg Test, Ur POSITIVE (A) NEGATIVE  ABO/Rh     Status: None (Preliminary result)   Collection Time: 07/19/16  4:41 PM  Result Value Ref Range   ABO/RH(D) O POS   CBC     Status: None   Collection Time: 07/19/16  4:41 PM  Result Value Ref Range   WBC 9.2 4.0 - 10.5 K/uL   RBC 4.39 3.87 - 5.11 MIL/uL   Hemoglobin 13.6 12.0 - 15.0 g/dL   HCT 16.1 09.6 - 04.5 %   MCV 89.3 78.0 - 100.0 fL   MCH 31.0 26.0 - 34.0 pg   MCHC 34.7 30.0 - 36.0 g/dL   RDW 40.9 81.1 - 91.4 %   Platelets 319 150 - 400 K/uL  hCG, quantitative, pregnancy     Status: Abnormal   Collection Time: 07/19/16  4:41 PM  Result Value Ref Range   hCG, Beta Chain, Quant, S 2,187 (H) <5 mIU/mL  Comprehensive metabolic panel     Status: Abnormal   Collection Time: 07/19/16  4:41 PM  Result Value Ref Range   Sodium 135 135 - 145 mmol/L   Potassium 3.6 3.5 - 5.1 mmol/L   Chloride 104 101 - 111 mmol/L   CO2 23 22 - 32 mmol/L   Glucose, Bld 88 65 - 99 mg/dL   BUN 15 6 - 20 mg/dL   Creatinine, Ser 7.82 0.44 - 1.00 mg/dL   Calcium 8.8 (L) 8.9 - 10.3 mg/dL   Total Protein 8.2 (H) 6.5 - 8.1 g/dL   Albumin 4.7 3.5 - 5.0 g/dL   AST 15 15 - 41 U/L   ALT 9 (L) 14 - 54 U/L   Alkaline Phosphatase 64 38 - 126 U/L   Total Bilirubin 0.9 0.3 - 1.2 mg/dL   GFR calc non Af Amer >60 >60 mL/min   GFR calc Af Amer >60 >60 mL/min   Anion gap 8 5 - 15  Wet prep, genital     Status: Abnormal   Collection Time: 07/19/16  6:00 PM  Result Value Ref Range   Yeast Wet Prep HPF POC PRESENT (A) NONE SEEN   Trich, Wet Prep NONE SEEN NONE SEEN   Clue Cells Wet Prep HPF POC NONE SEEN NONE SEEN   WBC, Wet Prep HPF POC FEW (A) NONE SEEN   Sperm NONE SEEN    US Ob Comp Less 14 Wks  Result Date: 07/19/2016 CLINICAL DATA:  Spotting 3 days  ago  with abdominal cramping today. Beta HCG 2,187 EXAM: OBSTETRIC <14 WK [place] AND TRANSVAGINAL OB US TECHNIQUE: Both transabdominal and transvaginal ultrasound examinations were performed for complete evaluation of the gestation as well as the maternal uterus, adnexal regions, and pelvic cul-de-sac. Transvaginal technique was performed to assess early pregnancy. COMPARISON:  None. FINDINGS: Intrauterine gestational sac: Single Yolk sac:  Not Visualized. Embryo:  Not Visualized. Cardiac Activity: Not Visualized. Heart Rate: Not applicable MSD: 3  mm   5 w   0  d Subchorionic hemorrhage:  None visualized. Maternal uterus/adnexae: Normal appearance of the maternal uterus and ovaries. IMPRESSION: Probable early intrauterine gestational sac, but no yolk sac, fetal pole, or cardiac activity yet visualized. Recommend follow-up quantitative B-HCG levels and follow-up US in 14 days to confirm and assess viability. This recommendation follows SRU consensus guidelines: Diagnostic Criteria for Nonviable Pregnancy Early in the First Trimester. Malva Limes Med 2013; 409:8119-14. Electronically Signed   By: Tollie Eth M.D.   On: 07/19/2016 18:41   US Ob Transvaginal  Result Date: 07/19/2016 CLINICAL DATA:  Spotting 3 days ago with abdominal cramping today. Beta HCG 2,187 EXAM: OBSTETRIC <14 WK [place] AND TRANSVAGINAL OB US TECHNIQUE: Both transabdominal and transvaginal ultrasound examinations were performed for complete evaluation of the gestation as well as the maternal uterus, adnexal regions, and pelvic cul-de-sac. Transvaginal technique was performed to assess early pregnancy. COMPARISON:  None. FINDINGS: Intrauterine gestational sac: Single Yolk sac:  Not Visualized. Embryo:  Not Visualized. Cardiac Activity: Not Visualized. Heart Rate: Not applicable MSD: 3  mm   5 w   0  d Subchorionic hemorrhage:  None visualized. Maternal uterus/adnexae: Normal appearance of the maternal uterus and ovaries. IMPRESSION: Probable early  intrauterine gestational sac, but no yolk sac, fetal pole, or cardiac activity yet visualized. Recommend follow-up quantitative B-HCG levels and follow-up US in 14 days to confirm and assess viability. This recommendation follows SRU consensus guidelines: Diagnostic Criteria for Nonviable Pregnancy Early in the First Trimester. Malva Limes Med 2013; 782:9562-13. Electronically Signed   By: Tollie Eth M.D.   On: 07/19/2016 18:41   MAU Course  Procedures  MDM Labs and [place] ordered and reviewed. Likely early pregnancy but cannot exclude failed pregnancy or ectopic. Will bring back for 48 hr quant. Stable for discharge home.   Assessment and Plan   1. Pregnancy, location unknown   2. Vaginal bleeding in pregnancy   3. Blood type, Rh positive   4. Morning sickness    Discharge home SAB/ectopic precautions Return to MAU on 5/19 for quant HCG Pregnancy verification letter provided Safe meds in pregnancy list provided  Allergies as of 07/19/2016   No Known Allergies     Medication List    STOP taking these medications   doxycycline 100 MG capsule Commonly known as:  VIBRAMYCIN   ibuprofen 400 MG tablet Commonly known as:  ADVIL,MOTRIN   metoCLOPramide 10 MG tablet Commonly known as:  REGLAN   ondansetron 8 MG tablet Commonly known as:  ZOFRAN   promethazine 12.5 MG tablet Commonly known as:  Ladona Horns, CNM 07/19/2016, 6:47 PM

## 2016-07-19 NOTE — MAU Note (Signed)
Been throwing up a lot, very nauseous,been passing out.  Started bleeding on Saturday, has not been as heavy as usual, some spotting and then just stopped.

## 2016-07-19 NOTE — Discharge Instructions (Signed)
Vaginal Bleeding During Pregnancy, First Trimester A small amount of bleeding (spotting) from the vagina is common in early pregnancy. Sometimes the bleeding is normal and is not a problem, and sometimes it is a sign of something serious. Be sure to tell your doctor about any bleeding from your vagina right away. Follow these instructions at home:  Watch your condition for any changes.  Follow your doctor's instructions about how active you can be.  If you are on bed rest:  You may need to stay in bed and only get up to use the bathroom.  You may be allowed to do some activities.  If you need help, make plans for someone to help you.  Write down:  The number of pads you use each day.  How often you change pads.  How soaked (saturated) your pads are.  Do not use tampons.  Do not douche.  Do not have sex or orgasms until your doctor says it is okay.  If you pass any tissue from your vagina, save the tissue so you can show it to your doctor.  Only take medicines as told by your doctor.  Do not take aspirin because it can make you bleed.  Keep all follow-up visits as told by your doctor. Contact a doctor if:  You bleed from your vagina.  You have cramps.  You have labor pains.  You have a fever that does not go away after you take medicine. Get help right away if:  You have very bad cramps in your back or belly (abdomen).  You pass large clots or tissue from your vagina.  You bleed more.  You feel light-headed or weak.  You pass out (faint).  You have chills.  You are leaking fluid or have a gush of fluid from your vagina.  You pass out while pooping (having a bowel movement). This information is not intended to replace advice given to you by your health care provider. Make sure you discuss any questions you have with your health care provider. Document Released: 07/07/2013 Document Revised: 07/29/2015 Document Reviewed: 10/28/2012 Elsevier Interactive  Patient Education  2017 Elsevier Inc. Morning Sickness Morning sickness is when you feel sick to your stomach (nauseous) during pregnancy. You may feel sick to your stomach and throw up (vomit). You may feel sick in the morning, but you can feel this way any time of day. Some women feel very sick to their stomach and cannot stop throwing up (hyperemesis gravidarum). Follow these instructions at home:  Only take medicines as told by your doctor.  Take multivitamins as told by your doctor. Taking multivitamins before getting pregnant can stop or lessen the harshness of morning sickness.  Eat dry toast or unsalted crackers before getting out of bed.  Eat 5 to 6 small meals a day.  Eat dry and bland foods like rice and baked potatoes.  Do not drink liquids with meals. Drink between meals.  Do not eat greasy, fatty, or spicy foods.  Have someone cook for you if the smell of food causes you to feel sick or throw up.  If you feel sick to your stomach after taking prenatal vitamins, take them at night or with a snack.  Eat protein when you need a snack (nuts, yogurt, cheese).  Eat unsweetened gelatins for dessert.  Wear a bracelet used for sea sickness (acupressure wristband).  Go to a doctor that puts thin needles into certain body points (acupuncture) to improve how you feel.  Do  not smoke.  Use a humidifier to keep the air in your house free of odors.  Get lots of fresh air. Contact a doctor if:  You need medicine to feel better.  You feel dizzy or lightheaded.  You are losing weight. Get help right away if:  You feel very sick to your stomach and cannot stop throwing up.  You pass out (faint). This information is not intended to replace advice given to you by your health care provider. Make sure you discuss any questions you have with your health care provider. Document Released: 03/30/2004 Document Revised: 07/29/2015 Document Reviewed: 08/07/2012 Elsevier Interactive  Patient Education  2017 [employer].

## 2016-07-20 LAB — HIV ANTIBODY (ROUTINE TESTING W REFLEX): HIV Screen 4th Generation wRfx: NONREACTIVE

## 2016-07-20 LAB — RPR: RPR: NONREACTIVE

## 2016-07-20 LAB — GC/CHLAMYDIA PROBE AMP (~~LOC~~) NOT AT ARMC
Chlamydia: NEGATIVE
NEISSERIA GONORRHEA: NEGATIVE

## 2016-07-22 ENCOUNTER — Inpatient Hospital Stay (HOSPITAL_COMMUNITY)
Admission: AD | Admit: 2016-07-22 | Discharge: 2016-07-22 | Disposition: A | Discharge status: Home / Self Care | Payer: Medicaid Other | Source: Ambulatory Visit | Attending: Obstetrics and Gynecology | Admitting: Obstetrics and Gynecology

## 2016-07-22 DIAGNOSIS — F17200 Nicotine dependence, unspecified, uncomplicated: Secondary | ICD-10-CM | POA: Insufficient documentation

## 2016-07-22 DIAGNOSIS — Z3A01 Less than 8 weeks gestation of pregnancy: Secondary | ICD-10-CM | POA: Insufficient documentation

## 2016-07-22 DIAGNOSIS — Z3490 Encounter for supervision of normal pregnancy, unspecified, unspecified trimester: Secondary | ICD-10-CM

## 2016-07-22 DIAGNOSIS — Z3491 Encounter for supervision of normal pregnancy, unspecified, first trimester: Secondary | ICD-10-CM

## 2016-07-22 DIAGNOSIS — O209 Hemorrhage in early pregnancy, unspecified: Secondary | ICD-10-CM | POA: Insufficient documentation

## 2016-07-22 LAB — HCG, QUANTITATIVE, PREGNANCY: HCG, BETA CHAIN, QUANT, S: 5828 m[IU]/mL — AB (ref <5)

## 2016-07-22 NOTE — MAU Note (Signed)
Patient here for repeat HCG, patient experiencing mild cramping in her back, rates 3/10, denies vaginal bleeding.

## 2016-07-22 NOTE — MAU Provider Note (Signed)
History   G1 @ apx 5.4 wks in for follow up for vag bleeding earlier in the week. States no bleeding now. Only occasional mild back discomfort a 1/10 on pain scale.  CSN: [id number]  Arrival date & time 07/22/16  0849   None     Chief Complaint  Patient presents with  . Follow-up    HPI  No past medical history on file.  No past surgical history on file.  No family history on file.  Social History  Substance Use Topics  . Smoking status: Current Every Day Smoker  . Smokeless tobacco: Never Used  . Alcohol use Yes     Comment: rare    OB History    Gravida Para Term Preterm AB Living   1             SAB TAB Ectopic Multiple Live Births                  Review of Systems  Constitutional: Negative.   HENT: Negative.   Eyes: Negative.   Respiratory: Negative.   Cardiovascular: Negative.   Gastrointestinal: Negative.   Endocrine: Negative.   Genitourinary: Negative.   Musculoskeletal: Positive for back pain.  Skin: Negative.     Allergies  Patient has no known allergies.  Home Medications    LMP  (LMP Unknown) Comment: bled 12-14, very light  Physical Exam  Constitutional: She is oriented to person, place, and time. She appears well-developed and well-nourished.  HENT:  Head: Normocephalic.  Cardiovascular: Normal rate.   Pulmonary/Chest: Effort normal.  Abdominal: Soft.  Neurological: She is alert and oriented to person, place, and time. She has normal reflexes.  Skin: Skin is warm and dry.  Psychiatric: She has a normal mood and affect. Her behavior is normal. Judgment and thought content normal.    MAU Course  Procedures (including critical care time)  Labs Reviewed  HCG, QUANTITATIVE, PREGNANCY - Abnormal; Notable for the following:       Result Value   hCG, Beta Chain, Quant, S 5,828 (*)    All other components within normal limits   No results found.   1. Early stage of pregnancy       MDM  Quant doubled, no bleeding or pain  will repeat u/s in 2 weeks. Pt d/c home

## 2016-07-27 ENCOUNTER — Inpatient Hospital Stay (HOSPITAL_COMMUNITY)
Admission: AD | Admit: 2016-07-27 | Discharge: 2016-07-27 | Disposition: A | Discharge status: Home / Self Care | Payer: Medicaid Other | Source: Ambulatory Visit | Attending: Obstetrics & Gynecology | Admitting: Obstetrics & Gynecology

## 2016-07-27 ENCOUNTER — Inpatient Hospital Stay (HOSPITAL_COMMUNITY): Payer: Medicaid Other

## 2016-07-27 ENCOUNTER — Encounter (HOSPITAL_COMMUNITY): Payer: Self-pay

## 2016-07-27 DIAGNOSIS — R109 Unspecified abdominal pain: Secondary | ICD-10-CM | POA: Insufficient documentation

## 2016-07-27 DIAGNOSIS — O26891 Other specified pregnancy related conditions, first trimester: Secondary | ICD-10-CM | POA: Insufficient documentation

## 2016-07-27 DIAGNOSIS — N930 Postcoital and contact bleeding: Secondary | ICD-10-CM | POA: Insufficient documentation

## 2016-07-27 DIAGNOSIS — O209 Hemorrhage in early pregnancy, unspecified: Secondary | ICD-10-CM

## 2016-07-27 DIAGNOSIS — O26899 Other specified pregnancy related conditions, unspecified trimester: Secondary | ICD-10-CM

## 2016-07-27 DIAGNOSIS — Z3A01 Less than 8 weeks gestation of pregnancy: Secondary | ICD-10-CM | POA: Insufficient documentation

## 2016-07-27 DIAGNOSIS — Z3491 Encounter for supervision of normal pregnancy, unspecified, first trimester: Secondary | ICD-10-CM

## 2016-07-27 HISTORY — DX: Other specified health status: Z78.9

## 2016-07-27 LAB — CBC
HEMATOCRIT: 40.9 % (ref 36.0–46.0)
HEMOGLOBIN: 14.1 g/dL (ref 12.0–15.0)
MCH: 30.9 pg (ref 26.0–34.0)
MCHC: 34.5 g/dL (ref 30.0–36.0)
MCV: 89.5 fL (ref 78.0–100.0)
Platelets: 262 10*3/uL (ref 150–400)
RBC: 4.57 MIL/uL (ref 3.87–5.11)
RDW: 14.2 % (ref 11.5–15.5)
WBC: 9.7 10*3/uL (ref 4.0–10.5)

## 2016-07-27 LAB — URINALYSIS, ROUTINE W REFLEX MICROSCOPIC
BILIRUBIN URINE: NEGATIVE
Glucose, UA: NEGATIVE mg/dL
Ketones, ur: NEGATIVE mg/dL
LEUKOCYTES UA: NEGATIVE
NITRITE: NEGATIVE
PH: 6 (ref 5.0–8.0)
Protein, ur: NEGATIVE mg/dL
SPECIFIC GRAVITY, URINE: 1.017 (ref 1.005–1.030)

## 2016-07-27 LAB — HCG, QUANTITATIVE, PREGNANCY: hCG, Beta Chain, Quant, S: 27686 m[IU]/mL — ABNORMAL HIGH (ref <5)

## 2016-07-27 LAB — POCT PREGNANCY, URINE: PREG TEST UR: POSITIVE — AB

## 2016-07-27 NOTE — MAU Provider Note (Signed)
History     CSN: [id number]  Arrival date and time: 07/27/16 1332  Chief Complaint  Patient presents with  . Abdominal Cramping  . Vaginal Bleeding   HPI Sue Mendoza is a 26 y.o. G1P0 at [redacted]w[redacted]d by LMP who presents with abdominal cramping & vaginal bleeding. Reports intermittent pink spotting since last night. Also had lower abdominal cramping last night that has since resolved.  Had intercourse last night prior to symptoms starting.   OB History    Gravida Para Term Preterm AB Living   1             SAB TAB Ectopic Multiple Live Births                  Past Medical History:  Diagnosis Date  . Medical history non-contributory     Past Surgical History:  Procedure Laterality Date  . NO PAST SURGERIES      History reviewed. No pertinent family history.  Social History  Substance Use Topics  . Smoking status: Current Every Day Smoker  . Smokeless tobacco: Never Used  . Alcohol use Yes     Comment: rare    Allergies: No Known Allergies  No prescriptions prior to admission.    Review of Systems  Gastrointestinal: Positive for abdominal pain (none currently). Negative for constipation, diarrhea, nausea and vomiting.  Genitourinary: Positive for vaginal bleeding. Negative for dysuria and vaginal discharge.   Physical Exam   Blood pressure 113/69, pulse 74, temperature 97.7 F (36.5 C), resp. rate 18, height 5\' 3"  (1.6 m), weight 135 lb (61.2 kg), last menstrual period 06/19/2016.  Physical Exam  Nursing note and vitals reviewed. Constitutional: She is oriented to person, place, and time. She appears well-developed and well-nourished. No distress.  HENT:  Head: Normocephalic and atraumatic.  Eyes: Conjunctivae are normal. Right eye exhibits no discharge. Left eye exhibits no discharge. No scleral icterus.  Neck: Normal range of motion.  Respiratory: Effort normal. No respiratory distress.  GI: Soft. She exhibits no distension. There is no tenderness.   Neurological: She is alert and oriented to person, place, and time.  Skin: Skin is warm and dry. She is not diaphoretic.  Psychiatric: She has a normal mood and affect. Her behavior is normal. Judgment and thought content normal.    MAU Course  Procedures Results for orders placed or performed during the hospital encounter of 07/27/16 (from the past 24 hour(s))  Urinalysis, Routine w reflex microscopic     Status: Abnormal   Collection Time: 07/27/16  1:50 PM  Result Value Ref Range   Color, Urine YELLOW YELLOW   APPearance CLEAR CLEAR   Specific Gravity, Urine 1.017 1.005 - 1.030   pH 6.0 5.0 - 8.0   Glucose, UA NEGATIVE NEGATIVE mg/dL   Hgb urine dipstick SMALL (A) NEGATIVE   Bilirubin Urine NEGATIVE NEGATIVE   Ketones, ur NEGATIVE NEGATIVE mg/dL   Protein, ur NEGATIVE NEGATIVE mg/dL   Nitrite NEGATIVE NEGATIVE   Leukocytes, UA NEGATIVE NEGATIVE   RBC / HPF 0-5 0 - 5 RBC/hpf   WBC, UA 0-5 0 - 5 WBC/hpf   Bacteria, UA RARE (A) NONE SEEN   Squamous Epithelial / LPF 0-5 (A) NONE SEEN   Mucous PRESENT   Pregnancy, urine POC     Status: Abnormal   Collection Time: 07/27/16  1:58 PM  Result Value Ref Range   Preg Test, Ur POSITIVE (A) NEGATIVE  CBC     Status: None   Collection  Time: 07/27/16  5:55 PM  Result Value Ref Range   WBC 9.7 4.0 - 10.5 K/uL   RBC 4.57 3.87 - 5.11 MIL/uL   Hemoglobin 14.1 12.0 - 15.0 g/dL   HCT 29.540.9 62.136.0 - 30.846.0 %   MCV 89.5 78.0 - 100.0 fL   MCH 30.9 26.0 - 34.0 pg   MCHC 34.5 30.0 - 36.0 g/dL   RDW 65.714.2 84.611.5 - 96.215.5 %   Platelets 262 150 - 400 K/uL  hCG, quantitative, pregnancy     Status: Abnormal   Collection Time: 07/27/16  5:55 PM  Result Value Ref Range   hCG, Beta Chain, Quant, S 27,686 (H) <5 mIU/mL    MDM CBC, HCG, ultrasound O positive Ultrasound shows SIUP with cardiac activity  Assessment and Plan  A: 1. Normal IUP (intrauterine pregnancy) on prenatal ultrasound, first trimester   2. Abdominal pain affecting pregnancy   3.  Postcoital bleeding    P: Discharge home Pregnancy verification letter & provider list given Start prenatal care Discussed reasons to return to MAU  Judeth HornErin Samir Ishaq 07/27/2016, 5:57 PM

## 2016-07-27 NOTE — Discharge Instructions (Signed)
Vaginal Bleeding During Pregnancy, First Trimester °A small amount of bleeding (spotting) from the vagina is relatively common in early pregnancy. It usually stops on its own. Various things may cause bleeding or spotting in early pregnancy. Some bleeding may be related to the pregnancy, and some may not. In most cases, the bleeding is normal and is not a problem. However, bleeding can also be a sign of something serious. Be sure to tell your health care provider about any vaginal bleeding right away. °Some possible causes of vaginal bleeding during the first trimester include: °· Infection or inflammation of the cervix. °· Growths (polyps) on the cervix. °· Miscarriage or threatened miscarriage. °· Pregnancy tissue has developed outside of the uterus and in a fallopian tube (tubal pregnancy). °· Tiny cysts have developed in the uterus instead of pregnancy tissue (molar pregnancy). °Follow these instructions at home: °Watch your condition for any changes. The following actions may help to lessen any discomfort you are feeling: °· Follow your health care provider's instructions for limiting your activity. If your health care provider orders bed rest, you may need to stay in bed and only get up to use the bathroom. However, your health care provider may allow you to continue light activity. °· If needed, make plans for someone to help with your regular activities and responsibilities while you are on bed rest. °· Keep track of the number of pads you use each day, how often you change pads, and how soaked (saturated) they are. Write this down. °· Do not use tampons. Do not douche. °· Do not have sexual intercourse or orgasms until approved by your health care provider. °· If you pass any tissue from your vagina, save the tissue so you can show it to your health care provider. °· Only take over-the-counter or prescription medicines as directed by your health care provider. °· Do not take aspirin because it can make you  bleed. °· Keep all follow-up appointments as directed by your health care provider. °Contact a health care provider if: °· You have any vaginal bleeding during any part of your pregnancy. °· You have cramps or labor pains. °· You have a fever, not controlled by medicine. °Get help right away if: °· You have severe cramps in your back or belly (abdomen). °· You pass large clots or tissue from your vagina. °· Your bleeding increases. °· You feel light-headed or weak, or you have fainting episodes. °· You have chills. °· You are leaking fluid or have a gush of fluid from your vagina. °· You pass out while having a bowel movement. °This information is not intended to replace advice given to you by your health care provider. Make sure you discuss any questions you have with your health care provider. °Document Released: 11/30/2004 Document Revised: 07/29/2015 Document Reviewed: 10/28/2012 °Elsevier Interactive Patient Education © 2017 Elsevier Inc. ° °

## 2016-07-27 NOTE — MAU Note (Signed)
Pt reports she started having some abd cramping and spotting last night. No spotting now but still having  cramping

## 2016-08-03 ENCOUNTER — Ambulatory Visit (HOSPITAL_COMMUNITY): Admission: RE | Admit: 2016-08-03 | Payer: Medicaid Other | Source: Ambulatory Visit

## 2016-08-24 ENCOUNTER — Inpatient Hospital Stay (HOSPITAL_COMMUNITY)
Admission: AD | Admit: 2016-08-24 | Discharge: 2016-08-24 | Disposition: A | Discharge status: Home / Self Care | Payer: Medicaid Other | Source: Ambulatory Visit | Attending: Obstetrics & Gynecology | Admitting: Obstetrics & Gynecology

## 2016-08-24 DIAGNOSIS — F17200 Nicotine dependence, unspecified, uncomplicated: Secondary | ICD-10-CM | POA: Insufficient documentation

## 2016-08-24 DIAGNOSIS — O26891 Other specified pregnancy related conditions, first trimester: Secondary | ICD-10-CM | POA: Insufficient documentation

## 2016-08-24 DIAGNOSIS — Z3A09 9 weeks gestation of pregnancy: Secondary | ICD-10-CM | POA: Diagnosis not present

## 2016-08-24 DIAGNOSIS — O99331 Smoking (tobacco) complicating pregnancy, first trimester: Secondary | ICD-10-CM | POA: Diagnosis not present

## 2016-08-24 DIAGNOSIS — R109 Unspecified abdominal pain: Secondary | ICD-10-CM | POA: Diagnosis present

## 2016-08-24 LAB — URINALYSIS, ROUTINE W REFLEX MICROSCOPIC
BILIRUBIN URINE: NEGATIVE
Glucose, UA: NEGATIVE mg/dL
Hgb urine dipstick: NEGATIVE
Ketones, ur: NEGATIVE mg/dL
Leukocytes, UA: NEGATIVE
NITRITE: NEGATIVE
Protein, ur: NEGATIVE mg/dL
SPECIFIC GRAVITY, URINE: 1.016 (ref 1.005–1.030)
pH: 8 (ref 5.0–8.0)

## 2016-08-24 LAB — CBC
HCT: 36 % (ref 36.0–46.0)
HEMOGLOBIN: 12.5 g/dL (ref 12.0–15.0)
MCH: 30.6 pg (ref 26.0–34.0)
MCHC: 34.7 g/dL (ref 30.0–36.0)
MCV: 88.2 fL (ref 78.0–100.0)
PLATELETS: 237 10*3/uL (ref 150–400)
RBC: 4.08 MIL/uL (ref 3.87–5.11)
RDW: 13.3 % (ref 11.5–15.5)
WBC: 6.9 10*3/uL (ref 4.0–10.5)

## 2016-08-24 NOTE — MAU Note (Signed)
Pt presents to MAU with complaints of lower abdominal cramping that started earlier today. Reports scant amount of pinkish vaginal bleeding on and off.

## 2016-08-24 NOTE — Discharge Instructions (Signed)
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
## 2016-09-16 ENCOUNTER — Inpatient Hospital Stay (HOSPITAL_COMMUNITY)
Admission: AD | Admit: 2016-09-16 | Discharge: 2016-09-16 | Disposition: A | Discharge status: Home / Self Care | Payer: Medicaid Other | Source: Ambulatory Visit | Attending: Obstetrics and Gynecology | Admitting: Obstetrics and Gynecology

## 2016-09-16 ENCOUNTER — Encounter (HOSPITAL_COMMUNITY): Payer: Self-pay

## 2016-09-16 DIAGNOSIS — O98811 Other maternal infectious and parasitic diseases complicating pregnancy, first trimester: Secondary | ICD-10-CM | POA: Insufficient documentation

## 2016-09-16 DIAGNOSIS — O209 Hemorrhage in early pregnancy, unspecified: Secondary | ICD-10-CM

## 2016-09-16 DIAGNOSIS — Z3A12 12 weeks gestation of pregnancy: Secondary | ICD-10-CM | POA: Diagnosis not present

## 2016-09-16 DIAGNOSIS — B373 Candidiasis of vulva and vagina: Secondary | ICD-10-CM | POA: Diagnosis not present

## 2016-09-16 DIAGNOSIS — O99331 Smoking (tobacco) complicating pregnancy, first trimester: Secondary | ICD-10-CM | POA: Diagnosis not present

## 2016-09-16 DIAGNOSIS — R1011 Right upper quadrant pain: Secondary | ICD-10-CM | POA: Diagnosis present

## 2016-09-16 DIAGNOSIS — O26891 Other specified pregnancy related conditions, first trimester: Secondary | ICD-10-CM

## 2016-09-16 DIAGNOSIS — R109 Unspecified abdominal pain: Secondary | ICD-10-CM

## 2016-09-16 DIAGNOSIS — B3731 Acute candidiasis of vulva and vagina: Secondary | ICD-10-CM

## 2016-09-16 LAB — URINALYSIS, ROUTINE W REFLEX MICROSCOPIC
Bilirubin Urine: NEGATIVE
Glucose, UA: NEGATIVE mg/dL
HGB URINE DIPSTICK: NEGATIVE
KETONES UR: NEGATIVE mg/dL
Leukocytes, UA: NEGATIVE
NITRITE: NEGATIVE
PROTEIN: NEGATIVE mg/dL
SPECIFIC GRAVITY, URINE: 1.019 (ref 1.005–1.030)
pH: 8 (ref 5.0–8.0)

## 2016-09-16 LAB — WET PREP, GENITAL
Clue Cells Wet Prep HPF POC: NONE SEEN
SPERM: NONE SEEN
Trich, Wet Prep: NONE SEEN

## 2016-09-16 LAB — COMPREHENSIVE METABOLIC PANEL
ALK PHOS: 46 U/L (ref 38–126)
ALT: 12 U/L — AB (ref 14–54)
ANION GAP: 6 (ref 5–15)
AST: 17 U/L (ref 15–41)
Albumin: 3.4 g/dL — ABNORMAL LOW (ref 3.5–5.0)
BILIRUBIN TOTAL: 0.4 mg/dL (ref 0.3–1.2)
BUN: 9 mg/dL (ref 6–20)
CALCIUM: 8.7 mg/dL — AB (ref 8.9–10.3)
CO2: 22 mmol/L (ref 22–32)
CREATININE: 0.43 mg/dL — AB (ref 0.44–1.00)
Chloride: 105 mmol/L (ref 101–111)
GFR calc non Af Amer: >60 mL/min (ref >60)
Glucose, Bld: 94 mg/dL (ref 65–99)
Potassium: 3.6 mmol/L (ref 3.5–5.1)
Sodium: 133 mmol/L — ABNORMAL LOW (ref 135–145)
TOTAL PROTEIN: 6.6 g/dL (ref 6.5–8.1)

## 2016-09-16 LAB — CBC
HEMATOCRIT: 32 % — AB (ref 36.0–46.0)
HEMOGLOBIN: 11.4 g/dL — AB (ref 12.0–15.0)
MCH: 31.1 pg (ref 26.0–34.0)
MCHC: 35.6 g/dL (ref 30.0–36.0)
MCV: 87.2 fL (ref 78.0–100.0)
Platelets: 218 10*3/uL (ref 150–400)
RBC: 3.67 MIL/uL — AB (ref 3.87–5.11)
RDW: 12.8 % (ref 11.5–15.5)
WBC: 7.1 10*3/uL (ref 4.0–10.5)

## 2016-09-16 LAB — AMYLASE: Amylase: 42 U/L (ref 28–100)

## 2016-09-16 LAB — LIPASE, BLOOD: LIPASE: 20 U/L (ref 11–51)

## 2016-09-16 MED ORDER — FLUCONAZOLE 200 MG PO TABS: 200.0000 mg | ORAL_TABLET | Freq: Every day | ORAL | 0 refills | Status: AC

## 2016-09-16 MED ORDER — OXYCODONE-ACETAMINOPHEN 5-325 MG PO TABS: 2.0000 | ORAL_TABLET | ORAL | 0 refills | Status: DC | PRN

## 2016-09-16 MED ORDER — OXYCODONE-ACETAMINOPHEN 5-325 MG PO TABS
1.0000 | ORAL_TABLET | Freq: Once | ORAL | Status: AC
  Administered 2016-09-16: 1 via ORAL
  Filled 2016-09-16: qty 1

## 2016-09-16 NOTE — Discharge Instructions (Signed)
[facility]    [facility]       Phone: [phone]  [facility] [place] Phone: [phone]  [facility] [place]  Phone: [phone]  [facility] [place]  Phone: [phone]  [facility] [place]  Phone: [phone]  [facility]       Phone: [phone]  [facility]    Phone: [phone]   [facility]    Phone: [phone]  [facility]    Phone: [phone]  [facility]    Phone: [phone]  [facility]    Phone: [phone]  [facility]       Phone: [phone]   [facility]  Phone: [phone]  [facility]    Phone: [phone]  [facility] [place]   Phone: [phone]  [facility]      Phone: [phone]  [facility]    Phone: [phone]    Abdominal Pain During Pregnancy Abdominal pain is common in pregnancy. Most of the time, it does not cause harm. There are many causes of abdominal pain. Some causes are more serious than others and sometimes the cause is not known. Abdominal pain can be a sign that something is very wrong with the pregnancy or the pain may have nothing to do with the pregnancy. Always tell your health care provider if you have any abdominal pain. Follow these instructions at home:  Do not have sex or put anything in your vagina until your symptoms go away completely.  Watch your abdominal pain for any changes.  Get plenty of rest until your pain improves.  Drink enough fluid to keep your urine clear or pale yellow.  Take over-the-counter or prescription medicines only as told by your health care provider.  Keep  all follow-up visits as told by your health care provider. This is important. Contact a health care provider if:  You have a fever.  Your pain gets worse or you have cramping.  Your pain continues after resting. Get help right away if:  You are bleeding, leaking fluid, or passing tissue from the vagina.  You have vomiting or diarrhea that does not go away.  You have painful or bloody urination.  You notice a decrease in your baby's movements.  You feel very weak or faint.  You have shortness of breath.  You develop a severe headache with abdominal pain.  You have abnormal vaginal discharge with abdominal pain. This information is not intended to replace advice given to you by your health care provider. Make sure you discuss any questions you have with your health care provider. Document Released: 02/20/2005 Document Revised: 12/02/2015 Document Reviewed: 09/19/2012 Elsevier Interactive Patient Education  [employer]2018 Elsevier Inc.

## 2016-09-16 NOTE — MAU Note (Signed)
Yesterday, she was throwing up and bled.  Today she is spotting and is having pain in rt upper quad, only with movement.  Feels like something is sitting right there.  Wants to be sure everything is ok, this is her first.

## 2016-09-16 NOTE — MAU Provider Note (Signed)
History     CSN: [id number]  Arrival date and time: 09/16/16 1349   First Provider Initiated Contact with Patient 09/16/16 1428     Chief Complaint  Patient presents with  . Vaginal Bleeding  . Abdominal Pain   HPI Sue Mendoza is a 26 y.o. G1P0 at [redacted]w[redacted]d who presents with right upper quadrant pain and vaginal bleeding. She states the pain started yesterday and is worse when she sits up or bends over. She rates the pain a 10/10 and tried tylenol with little relief. She denies any nausea, vomiting, constipation or diarrhea. She also reports vaginal spotting when she wipes. She states it was heavy like a period last night and has resolved now. Denies any abnormal discharge.   OB History    Gravida Para Term Preterm AB Living   1             SAB TAB Ectopic Multiple Live Births                  Past Medical History:  Diagnosis Date  . Medical history non-contributory     Past Surgical History:  Procedure Laterality Date  . NO PAST SURGERIES      History reviewed. No pertinent family history.  Social History  Substance Use Topics  . Smoking status: Current Every Day Smoker  . Smokeless tobacco: Never Used  . Alcohol use Yes     Comment: rare    Allergies: No Known Allergies  Prescriptions Prior to Admission  Medication Sig Dispense Refill Last Dose  . acetaminophen (TYLENOL) 500 MG tablet Take 1,000 mg by mouth every 6 (six) hours as needed for mild pain, moderate pain, fever or headache.   09/16/2016 at 1215  . Prenatal Vit-Fe Fumarate-FA (PRENATAL MULTIVITAMIN) TABS tablet Take 1 tablet by mouth daily.   09/15/2016 at Unknown time    Review of Systems  Constitutional: Negative.  Negative for chills and fever.  HENT: Negative.   Respiratory: Negative.  Negative for shortness of breath.   Cardiovascular: Negative.  Negative for chest pain.  Gastrointestinal: Positive for abdominal pain. Negative for constipation, diarrhea, nausea and vomiting.  Genitourinary:  Positive for vaginal bleeding. Negative for dysuria and vaginal discharge.  Neurological: Negative.  Negative for dizziness and headaches.  Psychiatric/Behavioral: Negative.    Physical Exam   Blood pressure 112/70, pulse (!) 102, temperature 98.1 F (36.7 C), temperature source Oral, resp. rate 16, weight 143 lb 4 oz (65 kg), last menstrual period 06/19/2016, SpO2 99 %.  Physical Exam  Nursing note and vitals reviewed. Constitutional: She appears well-developed and well-nourished.  HENT:  Head: Normocephalic and atraumatic.  Eyes: Conjunctivae are normal. No scleral icterus.  Cardiovascular: Normal rate, regular rhythm and normal heart sounds.   Respiratory: Effort normal and breath sounds normal. No respiratory distress.  GI: There is tenderness in the right upper quadrant. There is guarding.  Genitourinary: Vaginal discharge found.  Neurological: She is alert.  Skin: Skin is warm and dry.  Psychiatric: She has a normal mood and affect. Her behavior is normal. Judgment and thought content normal.   FHT: 158   MAU Course  Procedures Results for orders placed or performed during the hospital encounter of 09/16/16 (from the past 24 hour(s))  Urinalysis, Routine w reflex microscopic     Status: None   Collection Time: 09/16/16  2:01 PM  Result Value Ref Range   Color, Urine YELLOW YELLOW   APPearance CLEAR CLEAR   Specific Gravity, Urine 1.019  1.005 - 1.030   pH 8.0 5.0 - 8.0   Glucose, UA NEGATIVE NEGATIVE mg/dL   Hgb urine dipstick NEGATIVE NEGATIVE   Bilirubin Urine NEGATIVE NEGATIVE   Ketones, ur NEGATIVE NEGATIVE mg/dL   Protein, ur NEGATIVE NEGATIVE mg/dL   Nitrite NEGATIVE NEGATIVE   Leukocytes, UA NEGATIVE NEGATIVE  CBC     Status: Abnormal   Collection Time: 09/16/16  2:43 PM  Result Value Ref Range   WBC 7.1 4.0 - 10.5 K/uL   RBC 3.67 (L) 3.87 - 5.11 MIL/uL   Hemoglobin 11.4 (L) 12.0 - 15.0 g/dL   HCT 81.132.0 (L) 91.436.0 - 78.246.0 %   MCV 87.2 78.0 - 100.0 fL   MCH  31.1 26.0 - 34.0 pg   MCHC 35.6 30.0 - 36.0 g/dL   RDW 95.612.8 21.311.5 - 08.615.5 %   Platelets 218 150 - 400 K/uL  Comprehensive metabolic panel     Status: Abnormal   Collection Time: 09/16/16  2:43 PM  Result Value Ref Range   Sodium 133 (L) 135 - 145 mmol/L   Potassium 3.6 3.5 - 5.1 mmol/L   Chloride 105 101 - 111 mmol/L   CO2 22 22 - 32 mmol/L   Glucose, Bld 94 65 - 99 mg/dL   BUN 9 6 - 20 mg/dL   Creatinine, Ser 5.780.43 (L) 0.44 - 1.00 mg/dL   Calcium 8.7 (L) 8.9 - 10.3 mg/dL   Total Protein 6.6 6.5 - 8.1 g/dL   Albumin 3.4 (L) 3.5 - 5.0 g/dL   AST 17 15 - 41 U/L   ALT 12 (L) 14 - 54 U/L   Alkaline Phosphatase 46 38 - 126 U/L   Total Bilirubin 0.4 0.3 - 1.2 mg/dL   GFR calc non Af Amer >60 >60 mL/min   GFR calc Af Amer >60 >60 mL/min   Anion gap 6 5 - 15  Amylase     Status: None   Collection Time: 09/16/16  2:43 PM  Result Value Ref Range   Amylase 42 28 - 100 U/L  Lipase, blood     Status: None   Collection Time: 09/16/16  2:43 PM  Result Value Ref Range   Lipase 20 11 - 51 U/L  Wet prep, genital     Status: Abnormal   Collection Time: 09/16/16  3:10 PM  Result Value Ref Range   Yeast Wet Prep HPF POC PRESENT (A) NONE SEEN   Trich, Wet Prep NONE SEEN NONE SEEN   Clue Cells Wet Prep HPF POC NONE SEEN NONE SEEN   WBC, Wet Prep HPF POC FEW (A) NONE SEEN   Sperm NONE SEEN    MDM UA CBC, CMP, Amylase, Lipase 1 Percocet PO- patient reports relief from pain  Wet prep and gc/chlamydia Assessment and Plan   1. Candidiasis of vulva and vagina   2. Abdominal pain during pregnancy in first trimester   3. [redacted] weeks gestation of pregnancy    -Discharge patient home in stable condition - Prescription for diflucan sent to patient's pharmacy -Prescription for percocet for pain management -Outpatient ultrasound ordered to evaluate gall bladder -Start prenatal care ASAP with provider of choice -Encouraged to return here or to other Urgent Care/ED if she develops worsening of symptoms,  increase in pain, fever, or other concerning symptoms.   Cleone SlimCaroline Neill SN[facility] 09/16/2016, 3:26 PM

## 2016-09-16 NOTE — Progress Notes (Addendum)
G1 @ 12.[redacted] wksga. Presents to triage for bleeding and pain on right upper quadrant when moving. Bleeding started yesterday. States bright red on toilet paper but today pinikish. Pain started today.   Doppler: 158  Provider at bs for wet prep and GC  1552: discharge instructions given with pt understanding. Left unit via ambulatory

## 2016-09-18 LAB — GC/CHLAMYDIA PROBE AMP (~~LOC~~) NOT AT ARMC
CHLAMYDIA, DNA PROBE: NEGATIVE
Neisseria Gonorrhea: NEGATIVE

## 2016-09-28 ENCOUNTER — Ambulatory Visit (HOSPITAL_COMMUNITY): Payer: Medicaid Other

## 2016-09-28 DIAGNOSIS — Z3402 Encounter for supervision of normal first pregnancy, second trimester: Secondary | ICD-10-CM | POA: Diagnosis not present

## 2016-10-04 ENCOUNTER — Ambulatory Visit (HOSPITAL_COMMUNITY)
Admission: RE | Admit: 2016-10-04 | Discharge: 2016-10-04 | Disposition: A | Discharge status: Home / Self Care | Payer: Medicaid Other | Source: Ambulatory Visit | Attending: Nurse Practitioner | Admitting: Nurse Practitioner

## 2016-10-04 ENCOUNTER — Encounter (HOSPITAL_COMMUNITY): Payer: Self-pay

## 2016-10-04 ENCOUNTER — Ambulatory Visit (HOSPITAL_COMMUNITY): Admission: RE | Admit: 2016-10-04 | Payer: Self-pay | Source: Ambulatory Visit

## 2016-11-28 ENCOUNTER — Encounter (HOSPITAL_COMMUNITY): Payer: Self-pay | Admitting: *Deleted

## 2016-11-28 ENCOUNTER — Inpatient Hospital Stay (HOSPITAL_COMMUNITY)
Admission: AD | Admit: 2016-11-28 | Discharge: 2016-11-28 | Disposition: A | Discharge status: Home / Self Care | Payer: Medicaid Other | Source: Ambulatory Visit | Attending: Family Medicine | Admitting: Family Medicine

## 2016-11-28 DIAGNOSIS — O9989 Other specified diseases and conditions complicating pregnancy, childbirth and the puerperium: Secondary | ICD-10-CM

## 2016-11-28 DIAGNOSIS — Z3A23 23 weeks gestation of pregnancy: Secondary | ICD-10-CM | POA: Diagnosis not present

## 2016-11-28 DIAGNOSIS — Z87891 Personal history of nicotine dependence: Secondary | ICD-10-CM | POA: Diagnosis not present

## 2016-11-28 DIAGNOSIS — O26892 Other specified pregnancy related conditions, second trimester: Secondary | ICD-10-CM | POA: Diagnosis not present

## 2016-11-28 DIAGNOSIS — R079 Chest pain, unspecified: Secondary | ICD-10-CM | POA: Insufficient documentation

## 2016-11-28 DIAGNOSIS — R0789 Other chest pain: Secondary | ICD-10-CM

## 2016-11-28 LAB — URINALYSIS, ROUTINE W REFLEX MICROSCOPIC
Bilirubin Urine: NEGATIVE
Glucose, UA: NEGATIVE mg/dL
Hgb urine dipstick: NEGATIVE
Ketones, ur: NEGATIVE mg/dL
Leukocytes, UA: NEGATIVE
Nitrite: NEGATIVE
Protein, ur: 30 mg/dL — AB
Specific Gravity, Urine: 1.029 (ref 1.005–1.030)
pH: 6 (ref 5.0–8.0)

## 2016-11-28 MED ORDER — CYCLOBENZAPRINE HCL 5 MG PO TABS
7.5000 mg | ORAL_TABLET | Freq: Once | ORAL | Status: AC
  Administered 2016-11-28: 7.5 mg via ORAL
  Filled 2016-11-28: qty 2

## 2016-11-28 MED ORDER — CYCLOBENZAPRINE HCL 7.5 MG PO TABS: 7.5000 mg | ORAL_TABLET | Freq: Three times a day (TID) | ORAL | 0 refills | Status: DC | PRN

## 2016-11-28 NOTE — Progress Notes (Signed)
Ok to d/c efm per Dr Rachelle Hora

## 2016-11-28 NOTE — Discharge Instructions (Signed)
Musculoskeletal Pain  Musculoskeletal pain is muscle and bone aches and pains. This pain can occur in any part of the body.  Follow these instructions at home:  · Only take medicines for pain, discomfort, or fever as told by your health care provider.  · You may continue all activities unless the activities cause more pain. When the pain lessens, slowly resume normal activities. Gradually increase the intensity and duration of the activities or exercise.  · During periods of severe pain, bed rest may be helpful. Lie or sit in any position that is comfortable, but get out of bed and walk around at least every several hours.  · If directed, put ice on the injured area.  ? Put ice in a plastic bag.  ? Place a towel between your skin and the bag.  ? Leave the ice on for 20 minutes, 2-3 times a day.  Contact a health care provider if:  · Your pain is getting worse.  · Your pain is not relieved with medicines.  · You lose function in the area of the pain if the pain is in your arms, legs, or neck.  This information is not intended to replace advice given to you by your health care provider. Make sure you discuss any questions you have with your health care provider.  Document Released: 02/20/2005 Document Revised: 08/03/2015 Document Reviewed: 10/25/2012  Elsevier Interactive Patient Education © 2017 Elsevier Inc.

## 2016-11-28 NOTE — MAU Provider Note (Signed)
History     CSN: [id number]  Arrival date and time: 11/28/16 1522   None     Chief Complaint  Patient presents with  . Chest Pain   26 yo G1P0 at [redacted]w[redacted]d presents with acute, new onset left sided chest pain. Describes pain as sharp and pain is worse with deep breaths and when she moves her left arm. Pain radiates down left arm and into left breast. Rates pain 8/10. Has not taken any meds for pain. Denies fever, sweats, n/v, diarrhea. Denies recent injury. Had similar pain in the past and had full cardiac workup which was negative.     OB History    Gravida Para Term Preterm AB Living   1             SAB TAB Ectopic Multiple Live Births                  Past Medical History:  Diagnosis Date  . Medical history non-contributory     Past Surgical History:  Procedure Laterality Date  . NO PAST SURGERIES      Family History  Problem Relation Age of Onset  . Hypertension Mother   . Diabetes Maternal Grandmother     Social History  Substance Use Topics  . Smoking status: Former Games developer  . Smokeless tobacco: Never Used  . Alcohol use Yes     Comment: rare    Allergies: No Known Allergies  Prescriptions Prior to Admission  Medication Sig Dispense Refill Last Dose  . acetaminophen (TYLENOL) 500 MG tablet Take 1,000 mg by mouth every 6 (six) hours as needed for mild pain, moderate pain, fever or headache.   Past Month at Unknown time  . Prenatal Vit-Fe Fumarate-FA (PRENATAL MULTIVITAMIN) TABS tablet Take 1 tablet by mouth daily.   11/28/2016 at Unknown time  . oxyCODONE-acetaminophen (PERCOCET/ROXICET) 5-325 MG tablet Take 2 tablets by mouth every 4 (four) hours as needed for severe pain. 15 tablet 0     Review of Systems  Constitutional: Negative for chills, fatigue and fever.  HENT: Negative for congestion and ear pain.   Respiratory: Negative for cough, chest tightness and shortness of breath.   Cardiovascular: Positive for chest pain. Negative for palpitations and  leg swelling.  Gastrointestinal: Negative for abdominal pain, nausea and vomiting.  Genitourinary: Negative for difficulty urinating, dysuria and pelvic pain.  Musculoskeletal:       Left arm pain and left shoulder pain   Physical Exam   Blood pressure 125/64, pulse 81, temperature 97.8 F (36.6 C), temperature source Oral, resp. rate 18, weight 169 lb 1.3 oz (76.7 kg), last menstrual period 06/19/2016, SpO2 100 %.  Physical Exam  Constitutional: She is oriented to person, place, and time. She appears well-developed and well-nourished. No distress.  HENT:  Head: Normocephalic and atraumatic.  Eyes: Pupils are equal, round, and reactive to light. EOM are normal.  Neck: Normal range of motion. Neck supple.  Cardiovascular: Normal rate and regular rhythm.  Exam reveals no friction rub.   No murmur heard. Respiratory: Breath sounds normal. No respiratory distress. She has no wheezes.  GI: Soft. Bowel sounds are normal. She exhibits no mass. There is no rebound and no guarding.  Musculoskeletal: Normal range of motion. She exhibits no edema.  Tenderness to palpation of left ribs# 3-4. Muscle tightness palpated along    Neurological: She is alert and oriented to person, place, and time.  Skin: Skin is warm and dry. She is not diaphoretic.  MAU Course  Procedures  MDM Patient is well appearing and on her phone during interview. Tenderness on left anterior chest is reproducible and same pain that patient presents with. Will treat as msk pain with flexeril.  Assessment and Plan  1. Chest pain- most likely msk pain, will treat with flexeril and tylenol  Jaxyn Rout 11/28/2016, 4:12 PM

## 2016-11-28 NOTE — MAU Note (Signed)
Left sided chest pain radiates to back and to left arm--sharp in nature States feels numb at times Started today Constant pain Rating pain 7/10

## 2016-11-28 NOTE — MAU Note (Signed)
Pain is worse with movement and causes sharp pain in upper chest when taking a deep breath.

## 2016-11-28 NOTE — Progress Notes (Signed)
Written and verbal d/c instructions given and understanding voiced. 

## 2016-12-26 ENCOUNTER — Inpatient Hospital Stay (HOSPITAL_COMMUNITY)
Admission: AD | Admit: 2016-12-26 | Discharge: 2016-12-26 | Disposition: A | Discharge status: Home / Self Care | Payer: Medicaid Other | Source: Ambulatory Visit | Attending: Obstetrics and Gynecology | Admitting: Obstetrics and Gynecology

## 2016-12-26 ENCOUNTER — Encounter (HOSPITAL_COMMUNITY): Payer: Self-pay | Admitting: *Deleted

## 2016-12-26 DIAGNOSIS — O368120 Decreased fetal movements, second trimester, not applicable or unspecified: Secondary | ICD-10-CM | POA: Diagnosis present

## 2016-12-26 DIAGNOSIS — Z3A27 27 weeks gestation of pregnancy: Secondary | ICD-10-CM

## 2016-12-26 DIAGNOSIS — Z87891 Personal history of nicotine dependence: Secondary | ICD-10-CM | POA: Insufficient documentation

## 2016-12-26 DIAGNOSIS — O99512 Diseases of the respiratory system complicating pregnancy, second trimester: Secondary | ICD-10-CM | POA: Insufficient documentation

## 2016-12-26 DIAGNOSIS — J069 Acute upper respiratory infection, unspecified: Secondary | ICD-10-CM | POA: Insufficient documentation

## 2016-12-26 LAB — RAPID STREP SCREEN (MED CTR MEBANE ONLY): STREPTOCOCCUS, GROUP A SCREEN (DIRECT): NEGATIVE

## 2016-12-26 LAB — URINALYSIS, ROUTINE W REFLEX MICROSCOPIC
BILIRUBIN URINE: NEGATIVE
GLUCOSE, UA: NEGATIVE mg/dL
Hgb urine dipstick: NEGATIVE
KETONES UR: NEGATIVE mg/dL
Leukocytes, UA: NEGATIVE
NITRITE: NEGATIVE
PH: 5 (ref 5.0–8.0)
PROTEIN: NEGATIVE mg/dL
SPECIFIC GRAVITY, URINE: 1.023 (ref 1.005–1.030)

## 2016-12-26 LAB — INFLUENZA PANEL BY PCR (TYPE A & B)
Influenza A By PCR: NEGATIVE
Influenza B By PCR: NEGATIVE

## 2016-12-26 MED ORDER — GUAIFENESIN ER 600 MG PO TB12: 600.0000 mg | ORAL_TABLET | Freq: Two times a day (BID) | ORAL | 0 refills | Status: DC

## 2016-12-26 MED ORDER — PSEUDOEPHEDRINE HCL 30 MG PO TABS: 30.0000 mg | ORAL_TABLET | Freq: Four times a day (QID) | ORAL | 0 refills | Status: DC | PRN

## 2016-12-26 NOTE — MAU Note (Signed)
Pt reports she has had cold symptoms and a sore throat for the last week, the sore throat is getting much worse. Also reports decreased fetal movement for the last few days, states she is having fetal movement just now as much.

## 2016-12-26 NOTE — MAU Provider Note (Signed)
History     CSN: [id number]  Arrival date and time: 12/26/16 1015   First Provider Initiated Contact with Patient 12/26/16 1119      Chief Complaint  Patient presents with  . Decreased Fetal Movement  . Sore Throat  . Nasal Congestion   G1 @27 .1 weeks here with cold sx and decreased FM. She reports decreased FM x1 week. Reports nasal congestion, runny nose, sore throat, bilateral ear pressure, and production cough x1 week. She is using Tylenol cold and flu with little relief. Denies fever or chills. No SOB. No sick contacts but was traveling by plane last week.     OB History    Gravida Para Term Preterm AB Living   1             SAB TAB Ectopic Multiple Live Births                  Past Medical History:  Diagnosis Date  . Medical history non-contributory     Past Surgical History:  Procedure Laterality Date  . NO PAST SURGERIES      Family History  Problem Relation Age of Onset  . Hypertension Mother   . Diabetes Maternal Grandmother     Social History  Substance Use Topics  . Smoking status: Former Games developermoker  . Smokeless tobacco: Never Used  . Alcohol use No     Comment: rare    Allergies: No Known Allergies  Prescriptions Prior to Admission  Medication Sig Dispense Refill Last Dose  . Phenylephrine-DM-GG-APAP (TYLENOL COLD/FLU SEVERE) 5-10-200-325 MG TABS Take 2 tablets by mouth 4 (four) times daily as needed (for cold/flu symptoms).   12/25/2016 at Unknown time  . Prenatal Vit-Fe Fumarate-FA (PRENATAL MULTIVITAMIN) TABS tablet Take 1 tablet by mouth daily.   12/25/2016 at Unknown time  . acetaminophen (TYLENOL) 500 MG tablet Take 1,000 mg by mouth every 6 (six) hours as needed for mild pain, moderate pain, fever or headache.   prn  . cyclobenzaprine (FEXMID) 7.5 MG tablet Take 1 tablet (7.5 mg total) by mouth 3 (three) times daily as needed for muscle spasms. (Patient not taking: Reported on 12/26/2016) 30 tablet 0 Not Taking at Unknown time  .  oxyCODONE-acetaminophen (PERCOCET/ROXICET) 5-325 MG tablet Take 2 tablets by mouth every 4 (four) hours as needed for severe pain. (Patient not taking: Reported on 12/26/2016) 15 tablet 0 Not Taking at Unknown time    Review of Systems  Constitutional: Negative for chills and fever.  HENT: Positive for congestion, ear pain, rhinorrhea, sore throat and voice change.   Respiratory: Positive for cough. Negative for shortness of breath.    Physical Exam   Blood pressure 133/75, pulse (!) 106, temperature 98.2 F (36.8 C), temperature source Oral, resp. rate 16, height 5\' 3"  (1.6 m), weight 172 lb (78 kg), last menstrual period 06/19/2016, SpO2 100 %.  Physical Exam  Nursing note and vitals reviewed. Constitutional: She is oriented to person, place, and time. She appears well-developed and well-nourished. No distress.  HENT:  Head: Normocephalic and atraumatic.  Right Ear: Tympanic membrane, external ear and ear canal normal. No drainage.  Left Ear: Tympanic membrane, external ear and ear canal normal. No drainage.  Nose: Rhinorrhea (right) present. No mucosal edema or nasal deformity. Right sinus exhibits no maxillary sinus tenderness and no frontal sinus tenderness. Left sinus exhibits no maxillary sinus tenderness and no frontal sinus tenderness.  Mouth/Throat: Uvula is midline, oropharynx is clear and moist and mucous membranes are  normal.  Neck: Normal range of motion.  Cardiovascular: Normal rate, regular rhythm and normal heart sounds.   Respiratory: Effort normal. No respiratory distress. She has no wheezes. She has no rales.  GI: Soft. She exhibits no distension. There is no tenderness.  gravid  Musculoskeletal: Normal range of motion.  Neurological: She is alert and oriented to person, place, and time.  Skin: Skin is warm and dry.  Psychiatric: She has a normal mood and affect.  EFM: 145 bpm, mod variability, + accels, no decels Toco: none  Results for orders placed or  performed during the hospital encounter of 12/26/16 (from the past 24 hour(s))  Urinalysis, Routine w reflex microscopic     Status: Abnormal   Collection Time: 12/26/16 10:35 AM  Result Value Ref Range   Color, Urine YELLOW YELLOW   APPearance HAZY (A) CLEAR   Specific Gravity, Urine 1.023 1.005 - 1.030   pH 5.0 5.0 - 8.0   Glucose, UA NEGATIVE NEGATIVE mg/dL   Hgb urine dipstick NEGATIVE NEGATIVE   Bilirubin Urine NEGATIVE NEGATIVE   Ketones, ur NEGATIVE NEGATIVE mg/dL   Protein, ur NEGATIVE NEGATIVE mg/dL   Nitrite NEGATIVE NEGATIVE   Leukocytes, UA NEGATIVE NEGATIVE  Rapid strep screen (not at [facility])     Status: None   Collection Time: 12/26/16 11:30 AM  Result Value Ref Range   Streptococcus, Group A Screen (Direct) NEGATIVE NEGATIVE  Influenza panel by PCR (type A & B)     Status: None   Collection Time: 12/26/16 11:35 AM  Result Value Ref Range   Influenza A By PCR NEGATIVE NEGATIVE   Influenza B By PCR NEGATIVE NEGATIVE   MAU Course  Procedures  MDM Pt feeling good FM since EFM applied. Audible FM noted. No evidence of sinusitis, strep, influenza, or pneumonia. Sx likely caused by virus. Recommend Mucinex and Sudafed and supportive measures. Stable for discharge home.   Assessment and Plan   1. [redacted] weeks gestation of pregnancy   2. Upper respiratory virus   3. Decreased fetal movements in second trimester, single or unspecified fetus    Discharge home Follow up in OB office as scheduled Rx Mucinex Rx Sudafed Rest Hydrate FMCs  Allergies as of 12/26/2016   No Known Allergies     Medication List    STOP taking these medications   cyclobenzaprine 7.5 MG tablet Commonly known as:  FEXMID   oxyCODONE-acetaminophen 5-325 MG tablet Commonly known as:  PERCOCET/ROXICET   TYLENOL COLD/FLU SEVERE 5-10-200-325 MG Tabs Generic drug:  Phenylephrine-DM-GG-APAP     TAKE these medications   acetaminophen 500 MG tablet Commonly known as:  TYLENOL Take 1,000 mg by  mouth every 6 (six) hours as needed for mild pain, moderate pain, fever or headache.   guaiFENesin 600 MG 12 hr tablet Commonly known as:  MUCINEX Take 1 tablet (600 mg total) by mouth 2 (two) times daily.   prenatal multivitamin Tabs tablet Take 1 tablet by mouth daily.   pseudoephedrine 30 MG tablet Commonly known as:  SUDAFED Take 1 tablet (30 mg total) by mouth every 6 (six) hours as needed for congestion.      Donette Larry, CNM 12/26/2016, 11:35 AM

## 2016-12-26 NOTE — Discharge Instructions (Signed)
Fetal Movement Counts Patient Name: ________________________________________________ Patient Due Date: ____________________ What is a fetal movement count? A fetal movement count is the number of times that you feel your baby move during a certain amount of time. This may also be called a fetal kick count. A fetal movement count is recommended for every pregnant woman. You may be asked to start counting fetal movements as early as week 28 of your pregnancy. Pay attention to when your baby is most active. You may notice your baby's sleep and wake cycles. You may also notice things that make your baby move more. You should do a fetal movement count:  When your baby is normally most active.  At the same time each day.  A good time to count movements is while you are resting, after having something to eat and drink. How do I count fetal movements? 1. Find a quiet, comfortable area. Sit, or lie down on your side. 2. Write down the date, the start time and stop time, and the number of movements that you felt between those two times. Take this information with you to your health care visits. 3. For 2 hours, count kicks, flutters, swishes, rolls, and jabs. You should feel at least 10 movements during 2 hours. 4. You may stop counting after you have felt 10 movements. 5. If you do not feel 10 movements in 2 hours, have something to eat and drink. Then, keep resting and counting for 1 hour. If you feel at least 4 movements during that hour, you may stop counting. Contact a health care provider if:  You feel fewer than 4 movements in 2 hours.  Your baby is not moving like he or she usually does. Date: ____________ Start time: ____________ Stop time: ____________ Movements: ____________ Date: ____________ Start time: ____________ Stop time: ____________ Movements: ____________ Date: ____________ Start time: ____________ Stop time: ____________ Movements: ____________ Date: ____________ Start time:  ____________ Stop time: ____________ Movements: ____________ Date: ____________ Start time: ____________ Stop time: ____________ Movements: ____________ Date: ____________ Start time: ____________ Stop time: ____________ Movements: ____________ Date: ____________ Start time: ____________ Stop time: ____________ Movements: ____________ Date: ____________ Start time: ____________ Stop time: ____________ Movements: ____________ Date: ____________ Start time: ____________ Stop time: ____________ Movements: ____________ This information is not intended to replace advice given to you by your health care provider. Make sure you discuss any questions you have with your health care provider. Document Released: 03/22/2006 Document Revised: 10/20/2015 Document Reviewed: 04/01/2015 Elsevier Interactive Patient Education  2018 Elsevier Inc. Upper Respiratory Infection, Adult Most upper respiratory infections (URIs) are caused by a virus. A URI affects the nose, throat, and upper air passages. The most common type of URI is often called "the common cold." Follow these instructions at home:  Take medicines only as told by your doctor.  Gargle warm saltwater or take cough drops to comfort your throat as told by your doctor.  Use a warm mist humidifier or inhale steam from a shower to increase air moisture. This may make it easier to breathe.  Drink enough fluid to keep your pee (urine) clear or pale yellow.  Eat soups and other clear broths.  Have a healthy diet.  Rest as needed.  Go back to work when your fever is gone or your doctor says it is okay. ? You may need to stay home longer to avoid giving your URI to others. ? You can also wear a face mask and wash your hands often to prevent spread of the  virus.  Use your inhaler more if you have asthma.  Do not use any tobacco products, including cigarettes, chewing tobacco, or electronic cigarettes. If you need help quitting, ask your  doctor. Contact a doctor if:  You are getting worse, not better.  Your symptoms are not helped by medicine.  You have chills.  You are getting more short of breath.  You have brown or red mucus.  You have yellow or brown discharge from your nose.  You have pain in your face, especially when you bend forward.  You have a fever.  You have puffy (swollen) neck glands.  You have pain while swallowing.  You have white areas in the back of your throat. Get help right away if:  You have very bad or constant: ? Headache. ? Ear pain. ? Pain in your forehead, behind your eyes, and over your cheekbones (sinus pain). ? Chest pain.  You have long-lasting (chronic) lung disease and any of the following: ? Wheezing. ? Long-lasting cough. ? Coughing up blood. ? A change in your usual mucus.  You have a stiff neck.  You have changes in your: ? Vision. ? Hearing. ? Thinking. ? Mood. This information is not intended to replace advice given to you by your health care provider. Make sure you discuss any questions you have with your health care provider. Document Released: 08/09/2007 Document Revised: 10/24/2015 Document Reviewed: 05/28/2013 Elsevier Interactive Patient Education  2018 [employer]

## 2016-12-28 LAB — CULTURE, GROUP A STREP (THRC)

## 2017-01-23 ENCOUNTER — Inpatient Hospital Stay (HOSPITAL_COMMUNITY)
Admission: AD | Admit: 2017-01-23 | Discharge: 2017-01-23 | Disposition: A | Discharge status: Home / Self Care | Payer: Medicaid Other | Source: Ambulatory Visit | Attending: Obstetrics and Gynecology | Admitting: Obstetrics and Gynecology

## 2017-01-23 ENCOUNTER — Other Ambulatory Visit: Payer: Self-pay

## 2017-01-23 ENCOUNTER — Encounter (HOSPITAL_COMMUNITY): Payer: Self-pay | Admitting: *Deleted

## 2017-01-23 ENCOUNTER — Inpatient Hospital Stay (HOSPITAL_COMMUNITY): Payer: Medicaid Other

## 2017-01-23 DIAGNOSIS — Z3A30 30 weeks gestation of pregnancy: Secondary | ICD-10-CM | POA: Insufficient documentation

## 2017-01-23 DIAGNOSIS — O4703 False labor before 37 completed weeks of gestation, third trimester: Secondary | ICD-10-CM | POA: Diagnosis not present

## 2017-01-23 LAB — URINALYSIS, ROUTINE W REFLEX MICROSCOPIC
BACTERIA UA: NONE SEEN
BILIRUBIN URINE: NEGATIVE
GLUCOSE, UA: NEGATIVE mg/dL
HGB URINE DIPSTICK: NEGATIVE
Ketones, ur: NEGATIVE mg/dL
NITRITE: NEGATIVE
PH: 7 (ref 5.0–8.0)
Protein, ur: NEGATIVE mg/dL
SPECIFIC GRAVITY, URINE: 1.014 (ref 1.005–1.030)

## 2017-01-23 LAB — RAPID URINE DRUG SCREEN, HOSP PERFORMED
AMPHETAMINES: NOT DETECTED
BARBITURATES: NOT DETECTED
Benzodiazepines: NOT DETECTED
Cocaine: NOT DETECTED
OPIATES: NOT DETECTED
TETRAHYDROCANNABINOL: NOT DETECTED

## 2017-01-23 LAB — FETAL FIBRONECTIN: FETAL FIBRONECTIN: NEGATIVE

## 2017-01-23 NOTE — Discharge Instructions (Signed)

## 2017-01-23 NOTE — MAU Provider Note (Signed)
History     CSN: [id number]  Arrival date and time: 01/23/17 1310   First Provider Initiated Contact with Patient 01/23/17 1341      Chief Complaint  Patient presents with  . Contractions   Sela HildingShadae Veva HolesVazquez is a 26 y.o. G1P0 at 2274w1d who was sent over from the HD. She was seen there today because she reported bleeding. No bleeding was found on pelvic exam, FFN was colected and patient was 1-2cm dilated. She was on the monitor, and they noted frequent contractions there. Patient denies any pain, current vaginal bleeding or LOF. She reports normal fetal movement. She denies any complications with this pregnancy.   Pelvic Pain  The patient's primary symptoms include pelvic pain. The patient's pertinent negatives include no vaginal discharge. This is a new problem. The current episode started today. The problem occurs intermittently. The problem has been unchanged. The patient is experiencing no pain. The problem affects both sides. She is pregnant. Associated symptoms include abdominal pain. Pertinent negatives include no chills, dysuria, fever, frequency, nausea or vomiting. Nothing aggravates the symptoms. She has tried nothing for the symptoms.    Past Medical History:  Diagnosis Date  . Medical history non-contributory     Past Surgical History:  Procedure Laterality Date  . NO PAST SURGERIES      Family History  Problem Relation Age of Onset  . Hypertension Mother   . Diabetes Maternal Grandmother     Social History   Tobacco Use  . Smoking status: Former Games developermoker  . Smokeless tobacco: Never Used  Substance Use Topics  . Alcohol use: No    Comment: rare  . Drug use: No    Allergies: No Known Allergies  Medications Prior to Admission  Medication Sig Dispense Refill Last Dose  . Prenatal Vit-Fe Fumarate-FA (PRENATAL MULTIVITAMIN) TABS tablet Take 1 tablet by mouth daily.   01/23/2017 at Unknown time  . guaiFENesin (MUCINEX) 600 MG 12 hr tablet Take 1 tablet (600 mg  total) by mouth 2 (two) times daily. (Patient not taking: Reported on 01/23/2017) 30 tablet 0 Not Taking at Unknown time  . pseudoephedrine (SUDAFED) 30 MG tablet Take 1 tablet (30 mg total) by mouth every 6 (six) hours as needed for congestion. (Patient not taking: Reported on 01/23/2017) 24 tablet 0 Not Taking at Unknown time    Review of Systems  Constitutional: Negative for chills and fever.  Gastrointestinal: Positive for abdominal pain. Negative for nausea and vomiting.  Genitourinary: Positive for pelvic pain. Negative for dysuria, frequency, vaginal bleeding and vaginal discharge.   Physical Exam   Blood pressure 122/71, pulse 92, temperature 97.7 F (36.5 C), temperature source Oral, resp. rate 18, height 5\' 3"  (1.6 m), weight 178 lb (80.7 kg), last menstrual period 06/19/2016, SpO2 98 %.  Physical Exam  Nursing note and vitals reviewed. Constitutional: She is oriented to person, place, and time. She appears well-developed and well-nourished. No distress.  HENT:  Head: Normocephalic.  Cardiovascular: Normal rate.  Respiratory: Effort normal.  GI: Soft. There is no tenderness. There is no rebound.  Genitourinary:  Genitourinary Comments: Cervix: FT/Thick/ballotable   Neurological: She is alert and oriented to person, place, and time.  Skin: Skin is warm and dry.  Psychiatric: She has a normal mood and affect.    FHT: 130, moderate with 15x15 accels, she had a couple of variables with contractions x 2. Will send for BPP  Toco: very irreg ctx about every 15+mins  Results for orders placed or performed  during the hospital encounter of 01/23/17 (from the past 24 hour(s))  Urinalysis, Routine w reflex microscopic     Status: Abnormal   Collection Time: 01/23/17  1:30 PM  Result Value Ref Range   Color, Urine YELLOW YELLOW   APPearance CLEAR CLEAR   Specific Gravity, Urine 1.014 1.005 - 1.030   pH 7.0 5.0 - 8.0   Glucose, UA NEGATIVE NEGATIVE mg/dL   Hgb urine dipstick  NEGATIVE NEGATIVE   Bilirubin Urine NEGATIVE NEGATIVE   Ketones, ur NEGATIVE NEGATIVE mg/dL   Protein, ur NEGATIVE NEGATIVE mg/dL   Nitrite NEGATIVE NEGATIVE   Leukocytes, UA TRACE (A) NEGATIVE   RBC / HPF 0-5 0 - 5 RBC/hpf   WBC, UA 0-5 0 - 5 WBC/hpf   Bacteria, UA NONE SEEN NONE SEEN   Squamous Epithelial / LPF 0-5 (A) NONE SEEN   Mucus PRESENT   Urine rapid drug screen (hosp performed)     Status: None   Collection Time: 01/23/17  1:30 PM  Result Value Ref Range   Opiates NONE DETECTED NONE DETECTED   Cocaine NONE DETECTED NONE DETECTED   Benzodiazepines NONE DETECTED NONE DETECTED   Amphetamines NONE DETECTED NONE DETECTED   Tetrahydrocannabinol NONE DETECTED NONE DETECTED   Barbiturates NONE DETECTED NONE DETECTED  Fetal fibronectin     Status: None   Collection Time: 01/23/17  2:00 PM  Result Value Ref Range   Fetal Fibronectin NEGATIVE NEGATIVE   MAU Course  Procedures  MDM FHT: 130, moderate with 15x15 accels, no decels, after the initial ones noted.  Toco: very irreg.  BPP 6/8, total 8/10  1657: DW Dr. Adrian BlackwaterStinson, ok for DC home.   Assessment and Plan   1. Threatened premature labor in third trimester   2. [redacted] weeks gestation of pregnancy    DC home Comfort measures reviewed  3rd Trimester precautions  PTL precautions  Fetal kick counts RX: none  Return to MAU as needed FU with OB as planned  Follow-up Information    [facility]   Contact information: [address] [place] [address] [phone]            Thressa ShellerHeather Tayton Decaire 01/23/2017, 4:54 PM

## 2017-01-23 NOTE — MAU Note (Signed)
[facility] and decreased fetal movement. SVE 2/50, EFM showed contractions q2-4

## 2017-03-06 NOTE — L&D Delivery Note (Signed)
Delivery Note Called to room as pt labored precipitously to complete after remaining 1-2cm all night after receiving cytotec then Pit but refusing cervical foley placement. At 8:08 AM a viable female was delivered via Vaginal, Spontaneous (Presentation: ROA).  APGAR: 9, 9; weight: pending. Infant dried and placed on pt's abd; cord clamped and cut by FOB; hospital cord blood sample collected. Placenta status: spont, intact .  Cord: 3 vessel  Anesthesia:  Epidural Episiotomy: None Lacerations:  sm R labial lac, hemostatic and not repaired Est. Blood Loss (mL): 250  Mom to postpartum.  Baby to Couplet care / Skin to Skin.  Cam HaiSHAW, KIMBERLY CNM 03/09/2017, 9:01 AM

## 2017-03-08 ENCOUNTER — Encounter (HOSPITAL_COMMUNITY): Payer: Self-pay | Admitting: *Deleted

## 2017-03-08 ENCOUNTER — Inpatient Hospital Stay (HOSPITAL_COMMUNITY)
Admission: AD | Admit: 2017-03-08 | Discharge: 2017-03-11 | DRG: 807 | Disposition: A | Discharge status: Home / Self Care | Payer: Medicaid Other | Source: Ambulatory Visit | Attending: Obstetrics & Gynecology | Admitting: Obstetrics & Gynecology

## 2017-03-08 DIAGNOSIS — Z87891 Personal history of nicotine dependence: Secondary | ICD-10-CM | POA: Diagnosis not present

## 2017-03-08 DIAGNOSIS — O134 Gestational [pregnancy-induced] hypertension without significant proteinuria, complicating childbirth: Secondary | ICD-10-CM | POA: Diagnosis present

## 2017-03-08 DIAGNOSIS — Z3A37 37 weeks gestation of pregnancy: Secondary | ICD-10-CM

## 2017-03-08 DIAGNOSIS — O139 Gestational [pregnancy-induced] hypertension without significant proteinuria, unspecified trimester: Secondary | ICD-10-CM | POA: Diagnosis present

## 2017-03-08 DIAGNOSIS — O133 Gestational [pregnancy-induced] hypertension without significant proteinuria, third trimester: Secondary | ICD-10-CM

## 2017-03-08 LAB — COMPREHENSIVE METABOLIC PANEL
ALBUMIN: 2.7 g/dL — AB (ref 3.5–5.0)
ALT: 10 U/L — ABNORMAL LOW (ref 14–54)
ANION GAP: 9 (ref 5–15)
AST: 19 U/L (ref 15–41)
Alkaline Phosphatase: 135 U/L — ABNORMAL HIGH (ref 38–126)
BILIRUBIN TOTAL: 0.5 mg/dL (ref 0.3–1.2)
BUN: 9 mg/dL (ref 6–20)
CO2: 19 mmol/L — ABNORMAL LOW (ref 22–32)
Calcium: 8.6 mg/dL — ABNORMAL LOW (ref 8.9–10.3)
Chloride: 108 mmol/L (ref 101–111)
Creatinine, Ser: 0.59 mg/dL (ref 0.44–1.00)
GFR calc Af Amer: >60 mL/min (ref >60)
GFR calc non Af Amer: >60 mL/min (ref >60)
GLUCOSE: 117 mg/dL — AB (ref 65–99)
POTASSIUM: 4 mmol/L (ref 3.5–5.1)
SODIUM: 136 mmol/L (ref 135–145)
TOTAL PROTEIN: 6.5 g/dL (ref 6.5–8.1)

## 2017-03-08 LAB — CBC
HEMATOCRIT: 34 % — AB (ref 36.0–46.0)
Hemoglobin: 11.4 g/dL — ABNORMAL LOW (ref 12.0–15.0)
MCH: 28.6 pg (ref 26.0–34.0)
MCHC: 33.5 g/dL (ref 30.0–36.0)
MCV: 85.2 fL (ref 78.0–100.0)
PLATELETS: 179 10*3/uL (ref 150–400)
RBC: 3.99 MIL/uL (ref 3.87–5.11)
RDW: 14.7 % (ref 11.5–15.5)
WBC: 7.4 10*3/uL (ref 4.0–10.5)

## 2017-03-08 LAB — PROTEIN / CREATININE RATIO, URINE
Creatinine, Urine: 141 mg/dL
PROTEIN CREATININE RATIO: 0.11 mg/mg{creat} (ref 0.00–0.15)
Total Protein, Urine: 15 mg/dL

## 2017-03-08 LAB — OB RESULTS CONSOLE GBS: GBS: NEGATIVE

## 2017-03-08 LAB — TYPE AND SCREEN
ABO/RH(D): O POS
ANTIBODY SCREEN: NEGATIVE

## 2017-03-08 MED ORDER — LIDOCAINE HCL (PF) 1 % IJ SOLN
30.0000 mL | INTRAMUSCULAR | Status: DC | PRN
  Filled 2017-03-08: qty 30

## 2017-03-08 MED ORDER — ONDANSETRON HCL 4 MG/2ML IJ SOLN: 4.0000 mg | Freq: Four times a day (QID) | INTRAMUSCULAR | Status: DC | PRN

## 2017-03-08 MED ORDER — OXYCODONE-ACETAMINOPHEN 5-325 MG PO TABS: 1.0000 | ORAL_TABLET | ORAL | Status: DC | PRN

## 2017-03-08 MED ORDER — FENTANYL CITRATE (PF) 100 MCG/2ML IJ SOLN
100.0000 ug | INTRAMUSCULAR | Status: DC | PRN
  Administered 2017-03-09 (×3): 100 ug via INTRAVENOUS
  Filled 2017-03-08 (×3): qty 2

## 2017-03-08 MED ORDER — SOD CITRATE-CITRIC ACID 500-334 MG/5ML PO SOLN: 30.0000 mL | ORAL | Status: DC | PRN

## 2017-03-08 MED ORDER — OXYTOCIN 40 UNITS IN LACTATED RINGERS INFUSION - SIMPLE MED
1.0000 m[IU]/min | INTRAVENOUS | Status: DC
  Administered 2017-03-08: 2 m[IU]/min via INTRAVENOUS

## 2017-03-08 MED ORDER — TERBUTALINE SULFATE 1 MG/ML IJ SOLN
0.2500 mg | Freq: Once | INTRAMUSCULAR | Status: DC | PRN
  Filled 2017-03-08: qty 1

## 2017-03-08 MED ORDER — OXYTOCIN 40 UNITS IN LACTATED RINGERS INFUSION - SIMPLE MED: 1.0000 m[IU]/min | INTRAVENOUS | Status: DC

## 2017-03-08 MED ORDER — OXYTOCIN 40 UNITS IN LACTATED RINGERS INFUSION - SIMPLE MED
2.5000 [IU]/h | INTRAVENOUS | Status: DC
  Filled 2017-03-08 (×2): qty 1000

## 2017-03-08 MED ORDER — FLEET ENEMA 7-19 GM/118ML RE ENEM: 1.0000 | ENEMA | RECTAL | Status: DC | PRN

## 2017-03-08 MED ORDER — MISOPROSTOL 25 MCG QUARTER TABLET
25.0000 ug | ORAL_TABLET | ORAL | Status: DC | PRN
  Administered 2017-03-08 (×2): 25 ug via VAGINAL
  Filled 2017-03-08 (×2): qty 1

## 2017-03-08 MED ORDER — OXYCODONE-ACETAMINOPHEN 5-325 MG PO TABS: 2.0000 | ORAL_TABLET | ORAL | Status: DC | PRN

## 2017-03-08 MED ORDER — LACTATED RINGERS IV SOLN
500.0000 mL | INTRAVENOUS | Status: DC | PRN
  Administered 2017-03-08: 300 mL via INTRAVENOUS
  Administered 2017-03-09: 200 mL via INTRAVENOUS
  Administered 2017-03-09: 500 mL via INTRAVENOUS

## 2017-03-08 MED ORDER — ACETAMINOPHEN 325 MG PO TABS
650.0000 mg | ORAL_TABLET | ORAL | Status: DC | PRN
  Filled 2017-03-08: qty 2

## 2017-03-08 MED ORDER — OXYTOCIN BOLUS FROM INFUSION
500.0000 mL | Freq: Once | INTRAVENOUS | Status: AC
  Administered 2017-03-09: 500 mL via INTRAVENOUS

## 2017-03-08 MED ORDER — LACTATED RINGERS IV SOLN
INTRAVENOUS | Status: DC
  Administered 2017-03-08 – 2017-03-09 (×4): via INTRAVENOUS

## 2017-03-08 NOTE — H&P (Signed)
LABOR AND DELIVERY ADMISSION HISTORY AND PHYSICAL NOTE  Sue Mendoza is a 27 y.o. female G1P0 with IUP at [redacted]w[redacted]d by 5-wk U/S presenting for IOL for gestational HTN. Noted to have elevated BP at [facility] today. Pt denies headache, visual disturbances, RUQ/epigastric pain, or SOB.  She reports good fetal movement. She denies leakage of fluid or vaginal bleeding.  Prenatal History/Complications: PNC at [facility], [place] Pregnancy complications:  - Hx of tobacco and MJ use, quit when she found out she was pregnant - Varicella non-immune - Failed 1-hr glucola - passed 3-hr GTT - Family hx of Down's syndrome (paternal uncle)  Past Medical History: Past Medical History:  Diagnosis Date  . Medical history non-contributory     Past Surgical History: Past Surgical History:  Procedure Laterality Date  . NO PAST SURGERIES      Obstetrical History: OB History    Gravida Para Term Preterm AB Living   1             SAB TAB Ectopic Multiple Live Births           0      Social History: Social History   Socioeconomic History  . Marital status: Single    Spouse name: None  . Number of children: None  . Years of education: None  . Highest education level: None  Social Needs  . Financial resource strain: None  . Food insecurity - worry: None  . Food insecurity - inability: None  . Transportation needs - medical: None  . Transportation needs - non-medical: None  Occupational History  . None  Tobacco Use  . Smoking status: Former Games developer  . Smokeless tobacco: Never Used  Substance and Sexual Activity  . Alcohol use: No    Comment: rare  . Drug use: No  . Sexual activity: Yes    Birth control/protection: None  Other Topics Concern  . None  Social History Narrative  . None    Family History: Family History  Problem Relation Age of Onset  . Hypertension Mother   . Diabetes Maternal Grandmother     Allergies: No Known Allergies  Medications Prior to Admission   Medication Sig Dispense Refill Last Dose  . Prenatal Vit-Fe Fumarate-FA (PRENATAL MULTIVITAMIN) TABS tablet Take 1 tablet by mouth daily.   01/23/2017 at Unknown time     Review of Systems  All systems reviewed and negative except as stated in HPI  Physical Exam Blood pressure 137/86, pulse 96, height 5\' 3"  (1.6 m), weight 184 lb 1.6 oz (83.5 kg), last menstrual period 06/19/2016. General appearance: alert, oriented, NAD Lungs: normal respiratory effort Heart: regular rate Abdomen: soft, non-tender; gravid, FH appropriate for GA Extremities: No calf swelling or tenderness Presentation: cephalic by U/S Fetal monitoring: baseline rate 145, moderate variability, +acel, no decel Uterine activity: occasional ctx SVE: Dilation: 1 Effacement (%): 30 Exam by:: Dr Nira Retort  Prenatal labs: ABO, Rh: --/--/O POS (05/16 1641) Antibody: Negative (05/16 0000) Rubella: Immune (05/16 0000) RPR: Non Reactive (05/16 1641)  HBsAg: Negative (05/16 0000)  HIV: Non Reactive (05/16 1641)  GC/Chlamydia: negative GBS: Negative (01/03 0000)  1-hr Glucola: 167 --> 3-hr GTT normal per patient (will request updated records from [facility]) Genetic screening:  Negative Quad screen Anatomy US: normal mal  Prenatal Transfer Tool  Maternal Diabetes: No Genetic Screening: Normal Maternal Ultrasounds/Referrals: Normal Fetal Ultrasounds or other Referrals:  None Maternal Substance Abuse:  No Significant Maternal Medications:  None Significant Maternal Lab Results: None  Results for orders placed or performed during the hospital encounter of 03/08/17 (from the past 24 hour(s))  OB RESULT CONSOLE Group B Strep   Collection Time: 03/08/17 12:00 AM  Result Value Ref Range   GBS Negative     Patient Active Problem List   Diagnosis Date Noted  . Gestational HTN 03/08/2017    Assessment: Sue Mendoza is a 27 y.o. G1P0 at 854w3d here for IOL for GHTN.  #GHTN: check CBC, CMP, UPC. Admit for IOL #Labor:  cytotec for cervical ripening; will discuss FB with patient #Pain: Per patient's request; planning on epidural when in labor #FWB: Cat I #ID:  GBS neg #MOF: breast #MOC:POPs #Circ:  Likely outpatient  Kandra NicolasJulie P Hikari Tripp 03/08/2017, 12:57 PM

## 2017-03-08 NOTE — Anesthesia Pain Management Evaluation Note (Signed)
  CRNA Pain Management Visit Note  Patient: Sue Mendoza, 27 y.o., female  "Hello I am a member of the anesthesia team at [facility]. We have an anesthesia team available at all times to provide care throughout the hospital, including epidural management and anesthesia for C-section. I don't know your plan for the delivery whether it a natural birth, water birth, IV sedation, nitrous supplementation, doula or epidural, but we want to meet your pain goals."   1.Was your pain managed to your expectations on prior hospitalizations?   No prior hospitalizations  2.What is your expectation for pain management during this hospitalization?     Epidural  3.How can we help you reach that goal? unsure  Record the patient's initial score and the patient's pain goal.   Pain: 1  Pain Goal: 5 [facility] wants you to be able to say your pain was always managed very well.  Cephus ShellingBURGER,Chaysen Tillman 03/08/2017

## 2017-03-08 NOTE — Progress Notes (Signed)
LABOR PROGRESS NOTE  Sue Mendoza is a 27 y.o. G1P0 at 411w3d  admitted for IOL for GTHN  Subjective: Pt reports feeling some cramps, otherwise doing well. Denies HA, visual disturbances, or SOB.  Objective: BP 140/85   Pulse 87   Resp 20   Ht 5\' 3"  (1.6 m)   Wt 184 lb 1.6 oz (83.5 kg)   LMP 06/19/2016 (Approximate) Comment: bled 12-14, very light  BMI 32.61 kg/m  or  Vitals:   03/08/17 1212 03/08/17 1215 03/08/17 1401 03/08/17 1457  BP: 137/86  124/74 140/85  Pulse: 96  93 87  Resp:   20   Weight:  184 lb 1.6 oz (83.5 kg)    Height:  5\' 3"  (1.6 m)      SVE: Dilation: 1 Effacement (%): 60 Cervical Position: Posterior Presentation: Vertex Exam by:: Dr.Dejel FHT: baseline rate 150, moderate varibility, +acel, no decel Toco: irregular ctx  Assessment / Plan: 27 y.o. G1P0 at 1511w3d here for IOL for GHTN  Labor: cytotec #2 placed. Pt does not want FB placed Fetal Wellbeing:  Cat I Pain Control:  Per patient's request. May have epidural when in labor Anticipated MOD:  SVD  Frederik PearJulie P Degele, MD 03/08/2017, 6:06 PM

## 2017-03-08 NOTE — Progress Notes (Addendum)
Patient ID: Sue Mendoza, female   DOB: 1990/04/09, 27 y.o.   MRN: [id number] Doing well, feeling some pain with contractions  Vitals:   03/08/17 1853 03/08/17 1929 03/08/17 2013 03/08/17 2111  BP: 129/77 126/79 127/79 129/65  Pulse: 65 67 95 65  Resp: 18 16 16 16   Temp:  98.1 F (36.7 C)    TempSrc:  Oral    Weight:      Height:       FHR stable and reactive UCs irregular  Dilation: 1 Effacement (%): 90 Cervical Position: Middle Station: -2 Presentation: Vertex Exam by:: Artelia LarocheM. Williams, CNM  Cervix now almost totally effaced Since she is much more effaced, Will stop Cytotec and move to Pitocin 2x2

## 2017-03-09 ENCOUNTER — Encounter (HOSPITAL_COMMUNITY): Payer: Self-pay | Admitting: *Deleted

## 2017-03-09 ENCOUNTER — Inpatient Hospital Stay (HOSPITAL_COMMUNITY): Payer: Medicaid Other | Admitting: Anesthesiology

## 2017-03-09 DIAGNOSIS — Z3A37 37 weeks gestation of pregnancy: Secondary | ICD-10-CM

## 2017-03-09 DIAGNOSIS — O134 Gestational [pregnancy-induced] hypertension without significant proteinuria, complicating childbirth: Secondary | ICD-10-CM

## 2017-03-09 LAB — CBC
HEMATOCRIT: 30.5 % — AB (ref 36.0–46.0)
HEMOGLOBIN: 10.1 g/dL — AB (ref 12.0–15.0)
MCH: 27.8 pg (ref 26.0–34.0)
MCHC: 33.1 g/dL (ref 30.0–36.0)
MCV: 84 fL (ref 78.0–100.0)
Platelets: 162 10*3/uL (ref 150–400)
RBC: 3.63 MIL/uL — AB (ref 3.87–5.11)
RDW: 14.9 % (ref 11.5–15.5)
WBC: 12.3 10*3/uL — ABNORMAL HIGH (ref 4.0–10.5)

## 2017-03-09 LAB — RPR: RPR Ser Ql: NONREACTIVE

## 2017-03-09 MED ORDER — DIPHENHYDRAMINE HCL 50 MG/ML IJ SOLN: 12.5000 mg | INTRAMUSCULAR | Status: DC | PRN

## 2017-03-09 MED ORDER — SENNOSIDES-DOCUSATE SODIUM 8.6-50 MG PO TABS
2.0000 | ORAL_TABLET | ORAL | Status: DC
  Administered 2017-03-09: 2 via ORAL
  Filled 2017-03-09 (×2): qty 2

## 2017-03-09 MED ORDER — COCONUT OIL OIL: 1.0000 "application " | TOPICAL_OIL | Status: DC | PRN

## 2017-03-09 MED ORDER — PHENYLEPHRINE 40 MCG/ML (10ML) SYRINGE FOR IV PUSH (FOR BLOOD PRESSURE SUPPORT)
80.0000 ug | PREFILLED_SYRINGE | INTRAVENOUS | Status: DC | PRN
Start: 2017-03-09 — End: 2017-03-09
  Filled 2017-03-09: qty 5

## 2017-03-09 MED ORDER — IBUPROFEN 600 MG PO TABS
600.0000 mg | ORAL_TABLET | Freq: Four times a day (QID) | ORAL | Status: DC
  Administered 2017-03-09 – 2017-03-11 (×8): 600 mg via ORAL
  Filled 2017-03-09 (×8): qty 1

## 2017-03-09 MED ORDER — DIPHENHYDRAMINE HCL 25 MG PO CAPS: 25.0000 mg | ORAL_CAPSULE | Freq: Four times a day (QID) | ORAL | Status: DC | PRN

## 2017-03-09 MED ORDER — EPHEDRINE 5 MG/ML INJ
10.0000 mg | INTRAVENOUS | Status: DC | PRN
  Filled 2017-03-09: qty 2

## 2017-03-09 MED ORDER — BENZOCAINE-MENTHOL 20-0.5 % EX AERO
1.0000 "application " | INHALATION_SPRAY | CUTANEOUS | Status: DC | PRN
  Administered 2017-03-10: 1 via TOPICAL
  Filled 2017-03-09: qty 56

## 2017-03-09 MED ORDER — FENTANYL 2.5 MCG/ML BUPIVACAINE 1/10 % EPIDURAL INFUSION (WH - ANES): 14.0000 mL/h | INTRAMUSCULAR | Status: DC | PRN

## 2017-03-09 MED ORDER — LACTATED RINGERS IV SOLN: 500.0000 mL | Freq: Once | INTRAVENOUS | Status: DC

## 2017-03-09 MED ORDER — FENTANYL 2.5 MCG/ML BUPIVACAINE 1/10 % EPIDURAL INFUSION (WH - ANES)
INTRAMUSCULAR | Status: AC
  Filled 2017-03-09: qty 100

## 2017-03-09 MED ORDER — PRENATAL MULTIVITAMIN CH
1.0000 | ORAL_TABLET | Freq: Every day | ORAL | Status: DC
  Administered 2017-03-09 – 2017-03-10 (×2): 1 via ORAL
  Filled 2017-03-09 (×2): qty 1

## 2017-03-09 MED ORDER — PHENYLEPHRINE 40 MCG/ML (10ML) SYRINGE FOR IV PUSH (FOR BLOOD PRESSURE SUPPORT)
PREFILLED_SYRINGE | INTRAVENOUS | Status: AC
  Filled 2017-03-09: qty 20

## 2017-03-09 MED ORDER — OXYCODONE HCL 5 MG PO TABS
5.0000 mg | ORAL_TABLET | ORAL | Status: DC | PRN
  Filled 2017-03-09: qty 1

## 2017-03-09 MED ORDER — ACETAMINOPHEN 325 MG PO TABS: 650.0000 mg | ORAL_TABLET | ORAL | Status: DC | PRN

## 2017-03-09 MED ORDER — EPHEDRINE 5 MG/ML INJ: 10.0000 mg | INTRAVENOUS | Status: DC | PRN

## 2017-03-09 MED ORDER — LIDOCAINE HCL (PF) 1 % IJ SOLN
INTRAMUSCULAR | Status: DC | PRN
  Administered 2017-03-09 (×2): 5 mL via EPIDURAL

## 2017-03-09 MED ORDER — FENTANYL 2.5 MCG/ML BUPIVACAINE 1/10 % EPIDURAL INFUSION (WH - ANES)
14.0000 mL/h | INTRAMUSCULAR | Status: DC | PRN
  Administered 2017-03-09: 14 mL/h via EPIDURAL

## 2017-03-09 MED ORDER — PHENYLEPHRINE 40 MCG/ML (10ML) SYRINGE FOR IV PUSH (FOR BLOOD PRESSURE SUPPORT): 80.0000 ug | PREFILLED_SYRINGE | INTRAVENOUS | Status: DC | PRN

## 2017-03-09 MED ORDER — DIBUCAINE 1 % RE OINT: 1.0000 "application " | TOPICAL_OINTMENT | RECTAL | Status: DC | PRN

## 2017-03-09 MED ORDER — ONDANSETRON HCL 4 MG/2ML IJ SOLN: 4.0000 mg | INTRAMUSCULAR | Status: DC | PRN

## 2017-03-09 MED ORDER — WITCH HAZEL-GLYCERIN EX PADS: 1.0000 "application " | MEDICATED_PAD | CUTANEOUS | Status: DC | PRN

## 2017-03-09 MED ORDER — ONDANSETRON HCL 4 MG PO TABS: 4.0000 mg | ORAL_TABLET | ORAL | Status: DC | PRN

## 2017-03-09 MED ORDER — PROMETHAZINE HCL 25 MG/ML IJ SOLN
12.5000 mg | Freq: Once | INTRAMUSCULAR | Status: AC
  Administered 2017-03-09: 12.5 mg via INTRAVENOUS
  Filled 2017-03-09: qty 1

## 2017-03-09 MED ORDER — ZOLPIDEM TARTRATE 5 MG PO TABS: 5.0000 mg | ORAL_TABLET | Freq: Every evening | ORAL | Status: DC | PRN

## 2017-03-09 MED ORDER — TETANUS-DIPHTH-ACELL PERTUSSIS 5-2.5-18.5 LF-MCG/0.5 IM SUSP: 0.5000 mL | Freq: Once | INTRAMUSCULAR | Status: DC

## 2017-03-09 MED ORDER — OXYTOCIN 40 UNITS IN LACTATED RINGERS INFUSION - SIMPLE MED: 1.0000 m[IU]/min | INTRAVENOUS | Status: DC

## 2017-03-09 MED ORDER — LACTATED RINGERS IV SOLN
500.0000 mL | Freq: Once | INTRAVENOUS | Status: DC
Start: 2017-03-09 — End: 2017-03-09

## 2017-03-09 MED ORDER — SIMETHICONE 80 MG PO CHEW: 80.0000 mg | CHEWABLE_TABLET | ORAL | Status: DC | PRN

## 2017-03-09 NOTE — Anesthesia Postprocedure Evaluation (Signed)
Anesthesia Post Note  Patient: Sue Mendoza  Procedure(s) Performed: AN AD HOC LABOR EPIDURAL     Patient location during evaluation: Mother Baby Anesthesia Type: Epidural Level of consciousness: awake, awake and alert and oriented Pain management: pain level controlled Vital Signs Assessment: post-procedure vital signs reviewed and stable Respiratory status: spontaneous breathing, nonlabored ventilation and respiratory function stable Cardiovascular status: stable Postop Assessment: no headache, no backache, no apparent nausea or vomiting, adequate PO intake and patient able to bend at knees Anesthetic complications: no    Last Vitals:  Vitals:   03/09/17 1000 03/09/17 1100  BP: 128/64 (!) 123/56  Pulse: 69 69  Resp: 18 18  Temp: 37 C 36.7 C  SpO2:      Last Pain:  Vitals:   03/09/17 1400  TempSrc:   PainSc: 4    Pain Goal:                 [name]

## 2017-03-09 NOTE — Progress Notes (Signed)
Patient ID: Sue Mendoza, female   DOB: 15-May-1990, 27 y.o.   MRN: [id number] Hurting in her back  Wants to try some Fentanyl  Vitals:   03/08/17 2300 03/08/17 2330 03/09/17 0000 03/09/17 0041  BP: 131/79 (!) 141/86 122/68 128/77  Pulse: 71 72 67 74  Resp: 18 16  16   Temp:      TempSrc:      Weight:      Height:       FHR stable UCs every 1.5 min  Dilation: 1 Effacement (%): 90 Cervical Position: Middle Station: -2 Presentation: Vertex Exam by:: Artelia LarocheM. Devlin Mcveigh, CNM  Very difficult exam. Discussed Foley would help things go faster She will consider it

## 2017-03-09 NOTE — Progress Notes (Signed)
Patient ID: Sue Mendoza, female   DOB: 1990/08/03, 27 y.o.   MRN: [id number] Seen at 0610 this am Cervix rechecked with much difficulty Dilation: 2 Effacement (%): 90 Cervical Position: Middle Station: -1, -2 Presentation: Vertex Exam by:: Artelia LarocheM. Teagen Bucio, CNM   Patient was screaming and inconsolable States pain is excruciating  Fentanyl given with little relief Phenergan added to potentiate the Fentanyl, but patient still  Did not settle down, and wants epidural  I had made an agreement with her to place the Foley after the Phenergan, but she states she cannot tolerate it  So will let them put in epidural, albeit early in labor, due to inability to progress withinterventions  After Epidural will insert Foley bulb and continue to increase Pitocin

## 2017-03-09 NOTE — Addendum Note (Signed)
Addendum  created 03/09/17 1411 by Aletha Allebach, Doree Fudgeolleen S, CRNA   Charge Capture section accepted, Sign clinical note

## 2017-03-09 NOTE — Progress Notes (Signed)
Patient ID: Sue Mendoza, female   DOB: 07/05/1990, 27 y.o.   MRN: [id number] Still refusing Foley Bulb Discussed need to get labor moving in a more positive direction, she refuses  On 661mu/min of Pitocin Has frequent tachysystole, which I suspect is really more like uterine irritability  FHR has been reassuring  I told nurse to go up on Pitocin to see if we can improve quality of UCs As long as FHR is stable, will increase Pitocin in light of her refusal

## 2017-03-09 NOTE — Anesthesia Postprocedure Evaluation (Signed)
Anesthesia Post Note  Patient: Sue Mendoza  Procedure(s) Performed: AN AD HOC LABOR EPIDURAL     Patient location during evaluation: Mother Baby Anesthesia Type: Epidural Level of consciousness: awake and alert Pain management: pain level controlled Vital Signs Assessment: post-procedure vital signs reviewed and stable Respiratory status: spontaneous breathing, nonlabored ventilation and respiratory function stable Cardiovascular status: stable Postop Assessment: no headache, no backache and epidural receding Anesthetic complications: no    Last Vitals:  Vitals:   03/09/17 1000 03/09/17 1100  BP: 128/64 (!) 123/56  Pulse: 69 69  Resp: 18 18  Temp: 37 C 36.7 C  SpO2:      Last Pain:  Vitals:   03/09/17 1100  TempSrc: Oral  PainSc: Asleep   Pain Goal:                 [name]

## 2017-03-09 NOTE — Progress Notes (Signed)
Hysterical and inappropriate behavior on part of  patient during placement of epidural.  She could not hold still.  Overreacted during local and subsequent  placement of  Epidural.   If epidural does not work we recommend IV pain medications as she is immature and not a  reasonable candidate !

## 2017-03-09 NOTE — Anesthesia Preprocedure Evaluation (Addendum)
Anesthesia Evaluation  Patient identified by MRN, date of birth, ID band Patient awake    Reviewed: Allergy & Precautions, H&P , NPO status , Patient's Chart, lab work & pertinent test results, reviewed documented beta blocker date and time   Airway Mallampati: II  TM Distance: >3 FB Neck ROM: full    Dental no notable dental hx.    Pulmonary neg pulmonary ROS, former smoker,    Pulmonary exam normal breath sounds clear to auscultation       Cardiovascular negative cardio ROS Normal cardiovascular exam Rhythm:regular Rate:Normal     Neuro/Psych negative neurological ROS  negative psych ROS   GI/Hepatic negative GI ROS, Neg liver ROS,   Endo/Other  negative endocrine ROS  Renal/GU negative Renal ROS  negative genitourinary   Musculoskeletal   Abdominal   Peds  Hematology negative hematology ROS (+)   Anesthesia Other Findings  Hysterical and inappropriate behavior on part of  patient during placement of epidural.  She could not hold still.  Overreacted during local and subsequent  placement of  Epidural.   If epidural does not work we recommend IV pain medications as she is not a  reasonable candidate !  Reproductive/Obstetrics (+) Pregnancy                            Anesthesia Physical Anesthesia Plan  ASA: II  Anesthesia Plan: Epidural   Post-op Pain Management:    Induction:   PONV Risk Score and Plan:   Airway Management Planned:   Additional Equipment:   Intra-op Plan:   Post-operative Plan:   Informed Consent: I have reviewed the patients History and Physical, chart, labs and discussed the procedure including the risks, benefits and alternatives for the proposed anesthesia with the patient or authorized representative who has indicated his/her understanding and acceptance.     Plan Discussed with:   Anesthesia Plan Comments:         Anesthesia Quick  Evaluation

## 2017-03-09 NOTE — Anesthesia Procedure Notes (Signed)
Epidural Patient location during procedure: OB Start time: 03/09/2017 7:30 AM End time: 03/09/2017 7:45 AM  Staffing Anesthesiologist: Bethena Midgetddono, Ellisha Bankson, MD  Preanesthetic Checklist Completed: patient identified, site marked, surgical consent, pre-op evaluation, timeout performed, IV checked, risks and benefits discussed and monitors and equipment checked  Epidural Patient position: sitting Prep: site prepped and draped and DuraPrep Patient monitoring: continuous pulse ox and blood pressure Approach: midline Location: L3-L4 Injection technique: LOR air  Needle:  Needle type: Tuohy  Needle gauge: 17 G Needle length: 9 cm and 9 Needle insertion depth: 8 cm Catheter type: closed end flexible Catheter size: 19 Gauge Catheter at skin depth: 13 cm Test dose: negative  Assessment Events: blood not aspirated, injection not painful, no injection resistance, negative IV test and no paresthesia

## 2017-03-10 NOTE — Progress Notes (Signed)
Post Partum Day 1 Subjective: no complaints, up ad lib, tolerating PO and + flatus  Objective: Blood pressure 122/65, pulse 89, temperature 98.2 F (36.8 C), temperature source Oral, resp. rate 16, height 5\' 3"  (1.6 m), weight 81.8 kg (180 lb 5 oz), last menstrual period 06/19/2016, SpO2 100 %, unknown if currently breastfeeding.  Physical Exam:  General: alert, cooperative and no distress Lochia: appropriate Uterine Fundus: firm DVT Evaluation: No evidence of DVT seen on physical exam. Negative Homan's sign. No cords or calf tenderness. No significant calf/ankle edema.  Recent Labs    03/08/17 1248 03/09/17 0851  HGB 11.4* 10.1*  HCT 34.0* 30.5*    Assessment/Plan: Plan for discharge tomorrow and Contraception OCP   LOS: 2 days   Oralia ManisSherin Abraham 03/10/2017, 11:38 AM   OB FELLOW POSTPARTUM PROGRESS NOTE ATTESTATION  I have seen and examined this patient and agree with above documentation in the resident's note.  Patient doing well PPD Bottle feeding.   Frederik PearJulie P Aashritha Miedema, MD OB Fellow 4:39 PM

## 2017-03-11 MED ORDER — IBUPROFEN 600 MG PO TABS: 600.0000 mg | ORAL_TABLET | Freq: Four times a day (QID) | ORAL | 0 refills | Status: DC

## 2017-03-11 NOTE — Progress Notes (Addendum)
Pt's morning bp is 143/88. All other vital signs WNL. Patient has been up all night with baby, exhausted.  Will recommend to oncoming nurse to recheck in 2-3 hours, after pt has rested.

## 2017-03-11 NOTE — Discharge Summary (Signed)
OB Discharge Summary     Patient Name: Sue Mendoza DOB: 1990/11/10 MRN: [id number]  Date of admission: 03/08/2017 Delivering MD: Cam HaiSHAW, KIMBERLY   Date of discharge: 03/11/2017  Admitting diagnosis: INDUCTION Intrauterine pregnancy: 4949w4d     Secondary diagnosis:  Active Problems:   Gestational HTN  Additional problems:      Discharge diagnosis: Term Pregnancy Delivered                                                                                                Post partum procedures:n/a  Augmentation: Pitocin and Cytotec  Complications: None  Hospital course:  Induction of Labor With Vaginal Delivery   27 y.o. yo G1P1001 at 7949w4d was admitted to the hospital 03/08/2017 for induction of labor.  Indication for induction: Gestational hypertension.  Patient had an uncomplicated labor course as follows: Membrane Rupture Time/Date: 7:50 AM ,03/09/2017   Intrapartum Procedures: Episiotomy: None [1]                                         Lacerations:  Labial [10]  Patient had delivery of a Viable infant.  Information for the patient's newborn:  Garfield CorneaVazquez, Boy Maren [[id number]][[id number]]  Delivery Method: Vaginal, Spontaneous(Filed from Delivery Summary)   03/09/2017  Details of delivery can be found in separate delivery note.  Patient had a routine postpartum course. Patient is discharged home 03/11/17.  Physical exam  Vitals:   03/10/17 0500 03/10/17 1024 03/10/17 1717 03/11/17 0643  BP: 122/65  134/80 (!) 143/88  Pulse: 89  81 74  Resp: 16  16 18   Temp:  98.2 F (36.8 C) 98.3 F (36.8 C) 98 F (36.7 C)  TempSrc:  Oral Axillary Oral  SpO2:   100% 100%  Weight: 180 lb 5 oz (81.8 kg)   178 lb 6.4 oz (80.9 kg)  Height:       General: alert, cooperative and no distress Lochia: appropriate Uterine Fundus: firm Incision: n/a DVT Evaluation: No evidence of DVT seen on physical exam. Labs: Lab Results  Component Value Date   WBC 12.3 (H) 03/09/2017   HGB 10.1 (L) 03/09/2017   HCT  30.5 (L) 03/09/2017   MCV 84.0 03/09/2017   PLT 162 03/09/2017   CMP Latest Ref Rng & Units 03/08/2017  Glucose 65 - 99 mg/dL 782(N117(H)  BUN 6 - 20 mg/dL 9  Creatinine 5.620.44 - 1.301.00 mg/dL 8.650.59  Sodium 784135 - 696145 mmol/L 136  Potassium 3.5 - 5.1 mmol/L 4.0  Chloride 101 - 111 mmol/L 108  CO2 22 - 32 mmol/L 19(L)  Calcium 8.9 - 10.3 mg/dL 2.9(B8.6(L)  Total Protein 6.5 - 8.1 g/dL 6.5  Total Bilirubin 0.3 - 1.2 mg/dL 0.5  Alkaline Phos 38 - 126 U/L 135(H)  AST 15 - 41 U/L 19  ALT 14 - 54 U/L 10(L)    Discharge instruction: per After Visit Summary and "Baby and Me Booklet".  After visit meds:  Allergies as of 03/11/2017   No Known Allergies  Medication List    TAKE these medications   calcium carbonate 500 MG chewable tablet Commonly known as:  TUMS - dosed in mg elemental calcium Chew 1-2 tablets by mouth daily.   ibuprofen 600 MG tablet Commonly known as:  ADVIL,MOTRIN Take 1 tablet (600 mg total) by mouth every 6 (six) hours.   prenatal multivitamin Tabs tablet Take 1 tablet by mouth daily.       Diet: routine diet  Activity: Advance as tolerated. Pelvic rest for 6 weeks.   Outpatient follow up:4 weeks, baby love visit in 1 week ordered Follow up Appt:No future appointments. Follow up Visit:No Follow-up on file.  Postpartum contraception: Progesterone only pills  Newborn Data: Live born female  Birth Weight: 6 lb 5.2 oz (2870 g) APGAR: 9, 9  Newborn Delivery   Birth date/time:  03/09/2017 08:08:00 Delivery type:  Vaginal, Spontaneous     Baby Feeding: Bottle Disposition:home with mother   03/11/2017 Rolm Bookbinder, CNM

## 2017-03-11 NOTE — Discharge Instructions (Signed)
Vaginal Delivery, Care After °Refer to this sheet in the next few weeks. These instructions provide you with information about caring for yourself after vaginal delivery. Your health care provider may also give you more specific instructions. Your treatment has been planned according to current medical practices, but problems sometimes occur. Call your health care provider if you have any problems or questions. °What can I expect after the procedure? °After vaginal delivery, it is common to have: °· Some bleeding from your vagina. °· Soreness in your abdomen, your vagina, and the area of skin between your vaginal opening and your anus (perineum). °· Pelvic cramps. °· Fatigue. ° °Follow these instructions at home: °Medicines °· Take over-the-counter and prescription medicines only as told by your health care provider. °· If you were prescribed an antibiotic medicine, take it as told by your health care provider. Do not stop taking the antibiotic until it is finished. °Driving ° °· Do not drive or operate heavy machinery while taking prescription pain medicine. °· Do not drive for 24 hours if you received a sedative. °Lifestyle °· Do not drink alcohol. This is especially important if you are breastfeeding or taking medicine to relieve pain. °· Do not use tobacco products, including cigarettes, chewing tobacco, or e-cigarettes. If you need help quitting, ask your health care provider. °Eating and drinking °· Drink at least 8 eight-ounce glasses of water every day unless you are told not to by your health care provider. If you choose to breastfeed your baby, you may need to drink more water than this. °· Eat high-fiber foods every day. These foods may help prevent or relieve constipation. High-fiber foods include: °? Whole grain cereals and breads. °? Brown rice. °? Beans. °? Fresh fruits and vegetables. °Activity °· Return to your normal activities as told by your health care provider. Ask your health care provider  what activities are safe for you. °· Rest as much as possible. Try to rest or take a nap when your baby is sleeping. °· Do not lift anything that is heavier than your baby or 10 lb (4.5 kg) until your health care provider says that it is safe. °· Talk with your health care provider about when you can engage in sexual activity. This may depend on your: °? Risk of infection. °? Rate of healing. °? Comfort and desire to engage in sexual activity. °Vaginal Care °· If you have an episiotomy or a vaginal tear, check the area every day for signs of infection. Check for: °? More redness, swelling, or pain. °? More fluid or blood. °? Warmth. °? Pus or a bad smell. °· Do not use tampons or douches until your health care provider says this is safe. °· Watch for any blood clots that may pass from your vagina. These may look like clumps of dark red, brown, or black discharge. °General instructions °· Keep your perineum clean and dry as told by your health care provider. °· Wear loose, comfortable clothing. °· Wipe from front to back when you use the toilet. °· Ask your health care provider if you can shower or take a bath. If you had an episiotomy or a perineal tear during labor and delivery, your health care provider may tell you not to take baths for a certain length of time. °· Wear a bra that supports your breasts and fits you well. °· If possible, have someone help you with household activities and help care for your baby for at least a few days after   you leave the hospital. °· Keep all follow-up visits for you and your baby as told by your health care provider. This is important. °Contact a health care provider if: °· You have: °? Vaginal discharge that has a bad smell. °? Difficulty urinating. °? Pain when urinating. °? A sudden increase or decrease in the frequency of your bowel movements. °? More redness, swelling, or pain around your episiotomy or vaginal tear. °? More fluid or blood coming from your episiotomy or  vaginal tear. °? Pus or a bad smell coming from your episiotomy or vaginal tear. °? A fever. °? A rash. °? Little or no interest in activities you used to enjoy. °? Questions about caring for yourself or your baby. °· Your episiotomy or vaginal tear feels warm to the touch. °· Your episiotomy or vaginal tear is separating or does not appear to be healing. °· Your breasts are painful, hard, or turn red. °· You feel unusually sad or worried. °· You feel nauseous or you vomit. °· You pass large blood clots from your vagina. If you pass a blood clot from your vagina, save it to show to your health care provider. Do not flush blood clots down the toilet without having your health care provider look at them. °· You urinate more than usual. °· You are dizzy or light-headed. °· You have not breastfed at all and you have not had a menstrual period for 12 weeks after delivery. °· You have stopped breastfeeding and you have not had a menstrual period for 12 weeks after you stopped breastfeeding. °Get help right away if: °· You have: °? Pain that does not go away or does not get better with medicine. °? Chest pain. °? Difficulty breathing. °? Blurred vision or spots in your vision. °? Thoughts about hurting yourself or your baby. °· You develop pain in your abdomen or in one of your legs. °· You develop a severe headache. °· You faint. °· You bleed from your vagina so much that you fill two sanitary pads in one hour. °This information is not intended to replace advice given to you by your health care provider. Make sure you discuss any questions you have with your health care provider. °Document Released: 02/18/2000 Document Revised: 08/04/2015 Document Reviewed: 03/07/2015 °Elsevier Interactive Patient Education © 2018 [employer] ° °

## 2017-03-11 NOTE — Progress Notes (Signed)
When bedside report explained to patient, patient requested it be given outside room.

## 2017-03-23 ENCOUNTER — Telehealth: Payer: Self-pay | Admitting: *Deleted

## 2017-03-23 NOTE — Telephone Encounter (Signed)
Received a [facility]ic[facility] 03/22/17 [facility] ; a nurse with [employer] stating she is calling re: our request for a blood pressure check for this patient. States she and patient had tentatively set up a date /time . States she forgot to confirm the appointment and when she went out to do the visit; patient was not there. States she called Sue Mendoza and she is not staying at her usual place and she was upset she had not confirmed the appointment and did not agree to reschedule.   I called Sue Mendoza and she agreed to an appointment in our office Tuesday 03/27/17 at 2:30.  I also asked if she has a postpartum appointment scheduled elsewhere - she said no- I informed her she could make an pp appt when she is here Tuesday if she would like.

## 2017-03-27 ENCOUNTER — Ambulatory Visit: Payer: Medicaid Other

## 2017-05-16 ENCOUNTER — Emergency Department (HOSPITAL_COMMUNITY): Payer: Medicaid Other

## 2017-05-16 ENCOUNTER — Emergency Department (HOSPITAL_COMMUNITY)
Admission: EM | Admit: 2017-05-16 | Discharge: 2017-05-16 | Disposition: A | Discharge status: Home / Self Care | Payer: Medicaid Other | Attending: Emergency Medicine | Admitting: Emergency Medicine

## 2017-05-16 ENCOUNTER — Encounter (HOSPITAL_COMMUNITY): Payer: Self-pay | Admitting: Emergency Medicine

## 2017-05-16 DIAGNOSIS — R0789 Other chest pain: Secondary | ICD-10-CM | POA: Insufficient documentation

## 2017-05-16 DIAGNOSIS — Z87891 Personal history of nicotine dependence: Secondary | ICD-10-CM | POA: Insufficient documentation

## 2017-05-16 DIAGNOSIS — R0602 Shortness of breath: Secondary | ICD-10-CM | POA: Diagnosis not present

## 2017-05-16 DIAGNOSIS — Z79899 Other long term (current) drug therapy: Secondary | ICD-10-CM | POA: Insufficient documentation

## 2017-05-16 LAB — BASIC METABOLIC PANEL
Anion gap: 10 (ref 5–15)
BUN: 9 mg/dL (ref 6–20)
CALCIUM: 8.6 mg/dL — AB (ref 8.9–10.3)
CO2: 18 mmol/L — ABNORMAL LOW (ref 22–32)
CREATININE: 0.64 mg/dL (ref 0.44–1.00)
Chloride: 108 mmol/L (ref 101–111)
GFR calc non Af Amer: >60 mL/min (ref >60)
Glucose, Bld: 104 mg/dL — ABNORMAL HIGH (ref 65–99)
Potassium: 3.6 mmol/L (ref 3.5–5.1)
SODIUM: 136 mmol/L (ref 135–145)

## 2017-05-16 LAB — CBC
HCT: 36.5 % (ref 36.0–46.0)
Hemoglobin: 12.1 g/dL (ref 12.0–15.0)
MCH: 28.1 pg (ref 26.0–34.0)
MCHC: 33.2 g/dL (ref 30.0–36.0)
MCV: 84.9 fL (ref 78.0–100.0)
PLATELETS: 289 10*3/uL (ref 150–400)
RBC: 4.3 MIL/uL (ref 3.87–5.11)
RDW: 14.7 % (ref 11.5–15.5)
WBC: 7.7 10*3/uL (ref 4.0–10.5)

## 2017-05-16 LAB — I-STAT BETA HCG BLOOD, ED (MC, WL, AP ONLY)

## 2017-05-16 LAB — I-STAT TROPONIN, ED: TROPONIN I, POC: 0 ng/mL (ref 0.00–0.08)

## 2017-05-16 MED ORDER — DICLOFENAC SODIUM 75 MG PO TBEC: 75.0000 mg | DELAYED_RELEASE_TABLET | Freq: Two times a day (BID) | ORAL | 0 refills | Status: DC

## 2017-05-16 NOTE — Discharge Instructions (Signed)
Return if any problems.  See your Physician for recheck in 2-3 days,

## 2017-05-16 NOTE — ED Notes (Signed)
Pt stated her pain is not as severe as earlier this am.  Pain is currently 3:10 and only on inhalation.  Pt told this RN she would like a note for work and to be discharged due to a having a 102 month old she needs to get back to at home.

## 2017-05-16 NOTE — ED Provider Notes (Signed)
[facility] EMERGENCY DEPARTMENT Provider Note   CSN: [id number] Arrival date & time: 05/16/17  4098     History   Chief Complaint Chief Complaint  Patient presents with  . Chest Pain  . Shortness of Breath    HPI Sue Mendoza is a 27 y.o. female.  The history is provided by the patient. No language interpreter was used.  Chest Pain   This is a new problem. The current episode started 6 to 12 hours ago. The problem occurs constantly. The problem has been gradually worsening. The pain is associated with movement. The pain is present in the lateral region. The quality of the pain is described as brief. The pain does not radiate. Pertinent negatives include no fever and no shortness of breath. She has tried nothing for the symptoms. The treatment provided no relief. There are no known risk factors.  Pertinent negatives for past medical history include no anxiety/panic attacks and no recent injury.  Procedure history is negative for cardiac catheterization.  Shortness of Breath  Associated symptoms include chest pain. Pertinent negatives include no fever.    Past Medical History:  Diagnosis Date  . Medical history non-contributory     Patient Active Problem List   Diagnosis Date Noted  . Gestational HTN 03/08/2017    Past Surgical History:  Procedure Laterality Date  . NO PAST SURGERIES      OB History    Gravida Para Term Preterm AB Living   1 1 1     1    SAB TAB Ectopic Multiple Live Births         0 1       Home Medications    Prior to Admission medications   Medication Sig Start Date End Date Taking? Authorizing Provider  calcium carbonate (TUMS - DOSED IN MG ELEMENTAL CALCIUM) 500 MG chewable tablet Chew 1-2 tablets by mouth daily.    [provider]  diclofenac (VOLTAREN) 75 MG EC tablet Take 1 tablet (75 mg total) by mouth 2 (two) times daily. 05/16/17   Elson Areas, PA-C  ibuprofen (ADVIL,MOTRIN) 600 MG tablet Take 1  tablet (600 mg total) by mouth every 6 (six) hours. 03/11/17   Rolm Bookbinder, CNM  Prenatal Vit-Fe Fumarate-FA (PRENATAL MULTIVITAMIN) TABS tablet Take 1 tablet by mouth daily.    [provider]    Family History Family History  Problem Relation Age of Onset  . Hypertension Mother   . Diabetes Maternal Grandmother     Social History Social History   Tobacco Use  . Smoking status: Former Games developer  . Smokeless tobacco: Never Used  Substance Use Topics  . Alcohol use: No    Comment: rare  . Drug use: No     Allergies   Patient has no known allergies.   Review of Systems Review of Systems  Constitutional: Negative for fatigue and fever.  HENT: Negative for congestion and facial swelling.   Respiratory: Negative for shortness of breath.   Cardiovascular: Positive for chest pain.  Skin: Negative for color change.     Physical Exam Updated Vital Signs BP 109/73 (BP Location: Left Arm)   Pulse (!) 54   Temp 98 F (36.7 C) (Oral)   Resp 16   LMP 06/19/2016 (Approximate) Comment: bled 12-14, very light  SpO2 100%   Physical Exam  Constitutional: She appears well-developed and well-nourished. No distress.  HENT:  Head: Normocephalic and atraumatic.  Eyes: Conjunctivae are normal.  Neck: Neck  supple.  Cardiovascular: Normal rate, regular rhythm and normal pulses.  No murmur heard. Pulmonary/Chest: Effort normal and breath sounds normal. No respiratory distress.  Abdominal: Soft. There is no tenderness.  Musculoskeletal: She exhibits no edema.  Neurological: She is alert.  Skin: Skin is warm and dry.  Psychiatric: She has a normal mood and affect.  Nursing note and vitals reviewed.    ED Treatments / Results  Labs (all labs ordered are listed, but only abnormal results are displayed) Labs Reviewed  BASIC METABOLIC PANEL - Abnormal; Notable for the following components:      Result Value   CO2 18 (*)    Glucose, Bld 104 (*)    Calcium 8.6 (*)     All other components within normal limits  CBC  I-STAT TROPONIN, ED  I-STAT BETA HCG BLOOD, ED ([facility], [facility], [facility] ONLY)    EKG  EKG Interpretation None       Radiology Dg Chest 2 View  Result Date: 05/16/2017 CLINICAL DATA:  Chest pain EXAM: CHEST - 2 VIEW COMPARISON:  None. FINDINGS: Lungs are clear. Heart size and pulmonary vascularity are normal. No adenopathy. No pneumothorax. No bone lesions. IMPRESSION: No edema or consolidation. Electronically Signed   By: Bretta BangWilliam  Woodruff III M.D.   On: 05/16/2017 07:30    Procedures Procedures (including critical care time)  Medications Ordered in ED Medications - No data to display   Initial Impression / Assessment and Plan / ED Course  I have reviewed the triage vital signs and the nursing notes.  Pertinent labs & imaging results that were available during my care of the patient were reviewed by me and considered in my medical decision making (see chart for details).     MDM  Pt's pain occurs with movement.  Pt reports pain began after stretching.  I suspect muscular pain.  I doubt cardiac or pulmonary etiology  Final Clinical Impressions(s) / ED Diagnoses   Final diagnoses:  Chest wall pain    ED Discharge Orders        Ordered    diclofenac (VOLTAREN) 75 MG EC tablet  2 times daily     05/16/17 [address]       Sofia, Leslie K, [place] 05/16/17 1226    Jacalyn LefevreHaviland, Julie, MD 05/16/17 1626

## 2017-05-16 NOTE — ED Triage Notes (Signed)
Pt reports that she woke up and felt chest pain which then started SOB.  Pt had a baby 2 months ago other than that has not been experiencing any issues.  Denies n/v/ diaphoresis.  Pt states she initially experienced some dizziness, but that quickly stopped.

## 2017-05-16 NOTE — ED Notes (Signed)
Pt stable, ambulatory, and verbalizes understanding of d/c instructions.  

## 2018-10-04 IMAGING — US US OB TRANSVAGINAL
1 series · 15 of 28 positions shown · non-contrast
Comparison: Pelvic ultrasound performed 07/19/2016

CLINICAL DATA: Acute onset of pelvic cramping and vaginal spotting.
Initial encounter.

EXAM:
TRANSVAGINAL OB ULTRASOUND
TECHNIQUE: Transvaginal ultrasound was performed for complete evaluation of the
gestation as well as the maternal uterus, adnexal regions, and
pelvic cul-de-sac.

[Series 1: us ob transvaginal · 35 acquisitions, 15 frames shown]
[im 1/35]
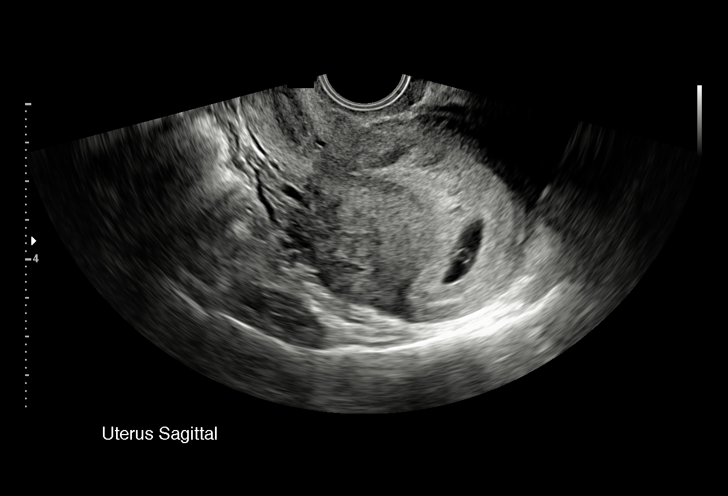
[im 3/35]
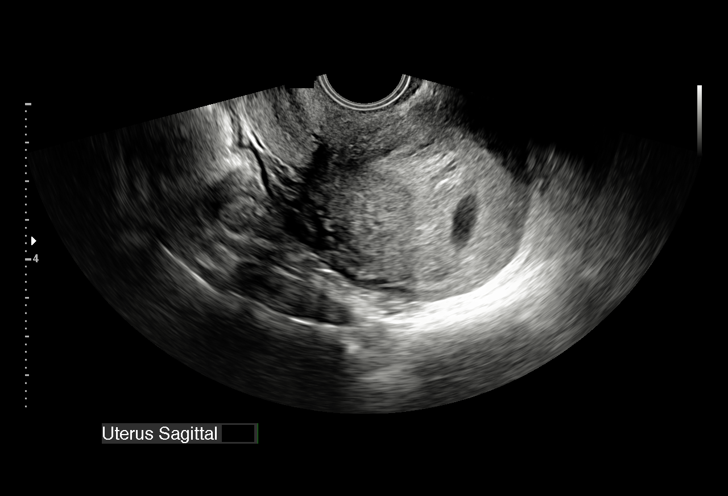
[im 6/35]
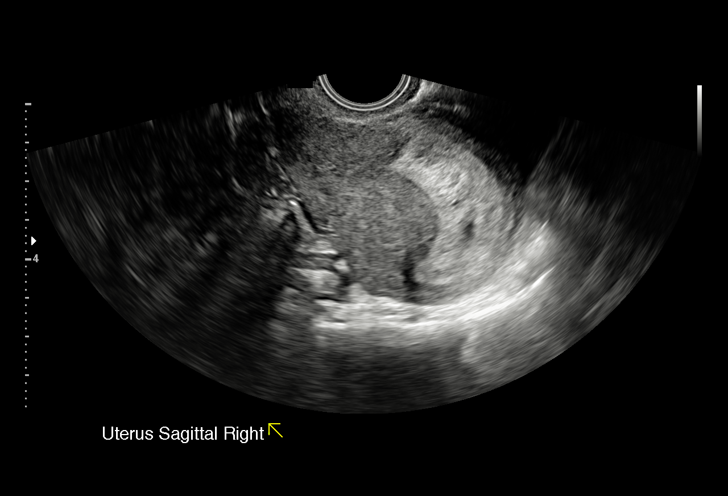
[im 8/35]
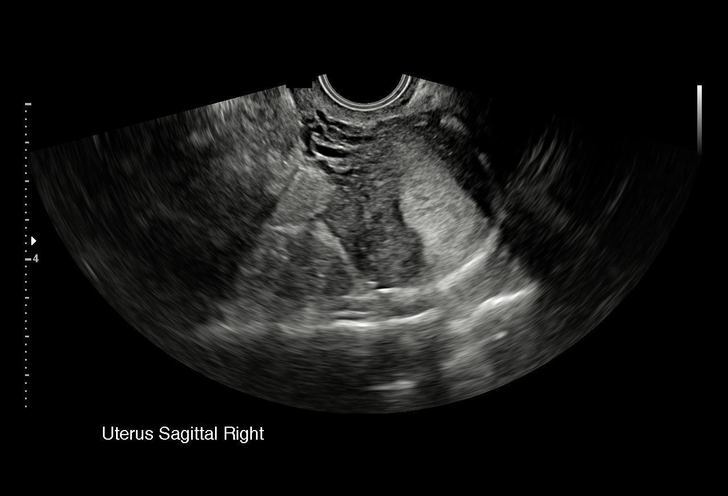
[im 11/35]
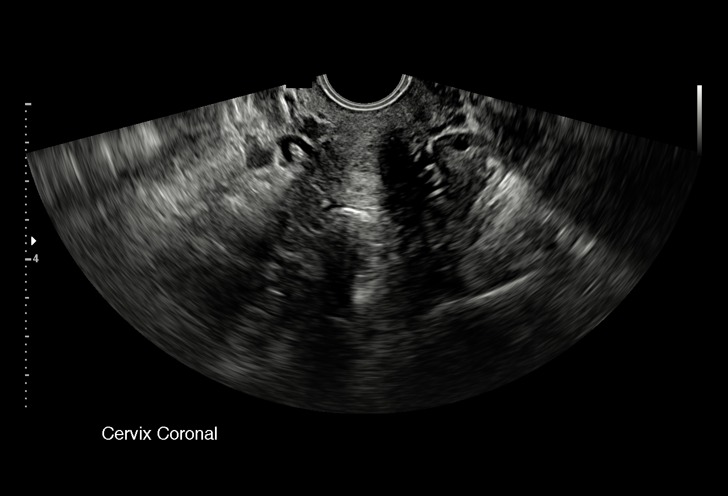
[im 13/35]
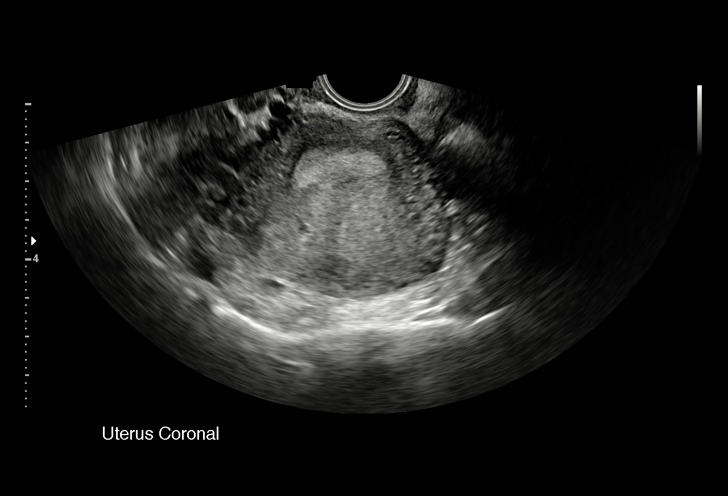
[im 16/35]
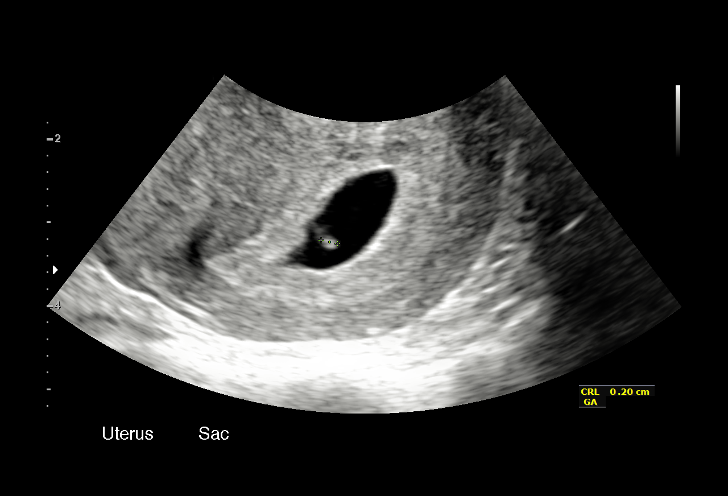
[im 18/35]
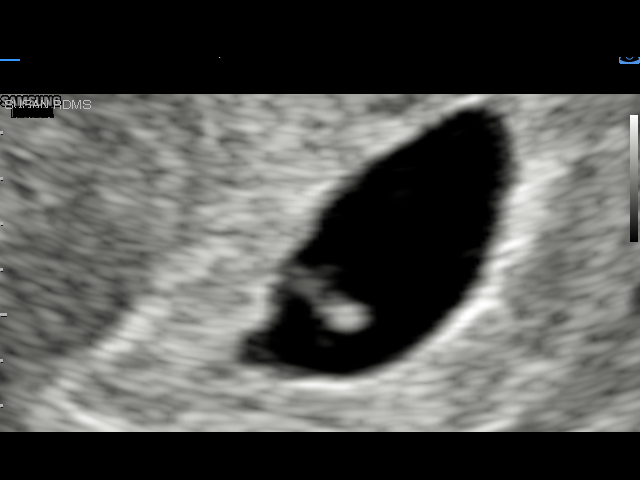
[im 19/35]
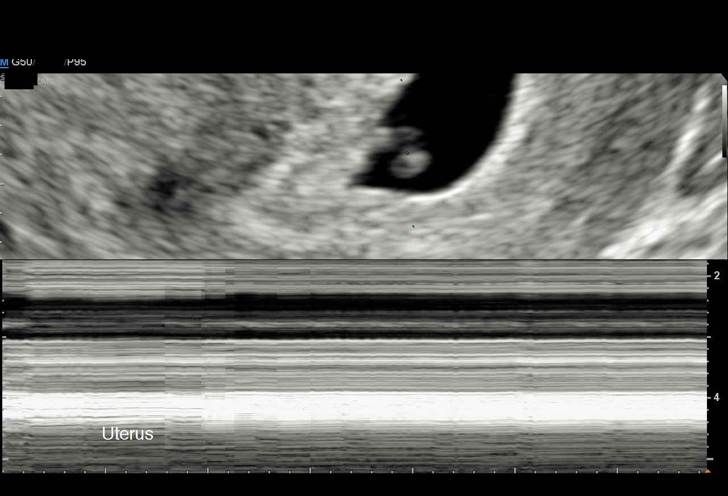
[im 22/35]
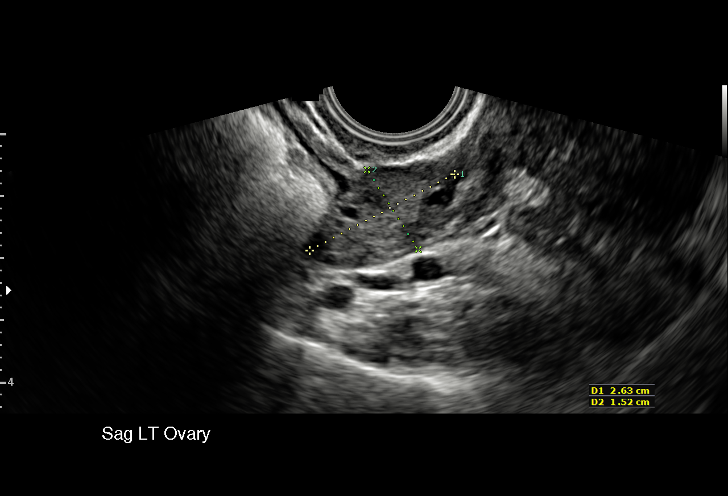
[im 24/35]
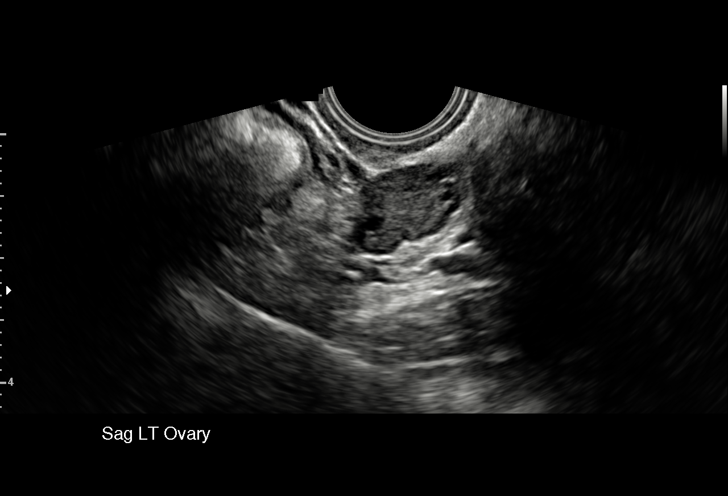
[im 27/35]
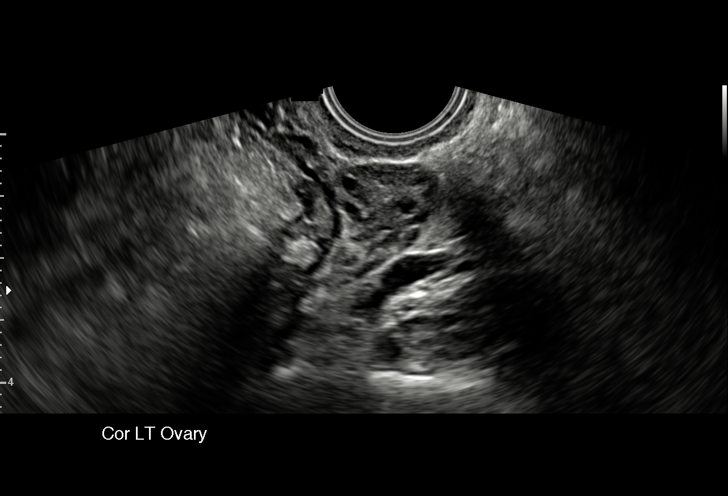
[im 29/35]
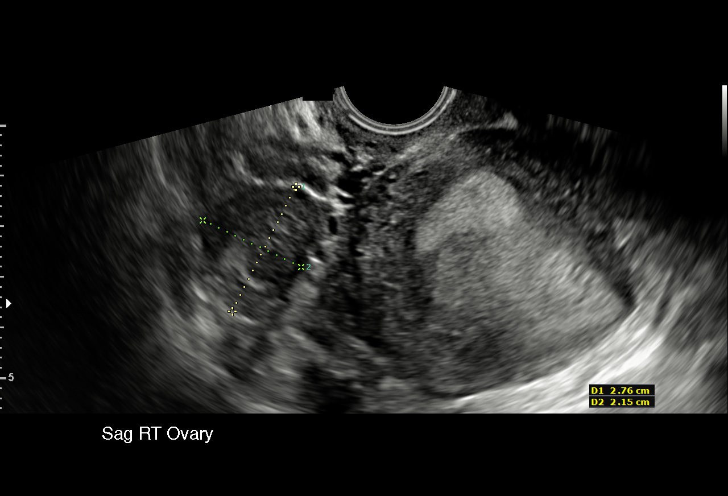
[im 32/35]
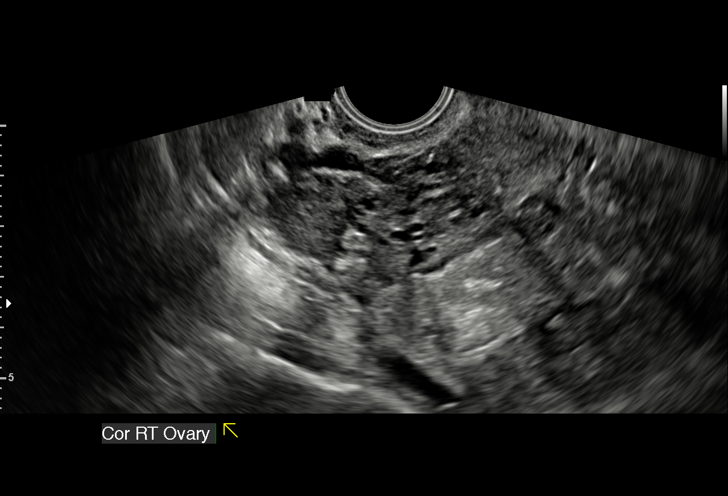
[im 35/35]
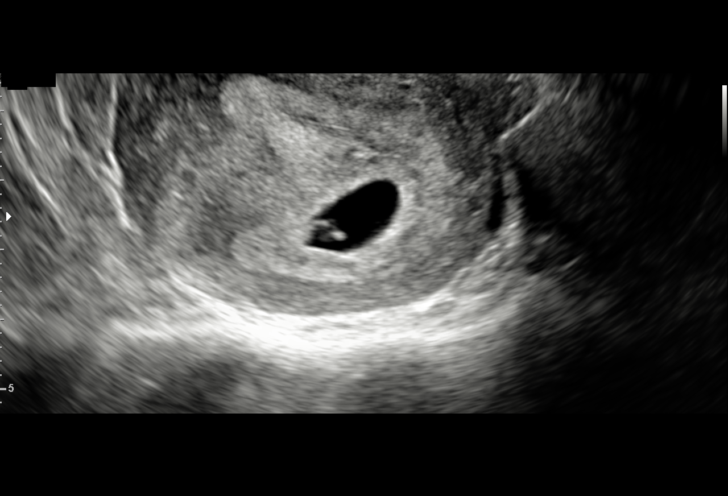

[15 of 28 positions shown; findings below may reference images not displayed]

FINDINGS: Intrauterine gestational sac: Single; visualized and normal in
shape.

Yolk sac:  Yes

Embryo:  Yes

Cardiac Activity: Yes

Heart Rate: 95 bpm

CRL:   2 mm   5 w 5 d                  US EDC: 03/24/2017

Subchorionic hemorrhage:  None visualized.

Maternal uterus/adnexae: The uterus is otherwise unremarkable in
appearance.

The ovaries are within normal limits. The right ovary measures 2.8 x
2.2 x 1.9 cm, while the left ovary measures 2.6 x 1.5 x 1.3 cm. No
suspicious adnexal masses are seen; there is no evidence for ovarian
torsion.

No free fluid is seen within the pelvic cul-de-sac.
IMPRESSION: Single live intrauterine pregnancy noted, with a crown-rump length
of 2 mm, corresponding to a gestational age of 5 weeks 5 days. This
matches the gestational age of 5 weeks 3 days by LMP, reflecting an
estimated date of delivery March 26, 2017.

## 2019-04-02 IMAGING — US US MFM FETAL BPP W/O NON-STRESS
1 series · 16 of 23 positions shown · non-contrast
Comparison: none

[Series 1: us mfm fetal bpp w/o non-stress · 23 acquisitions, 16 frames shown]
[im 1/23]
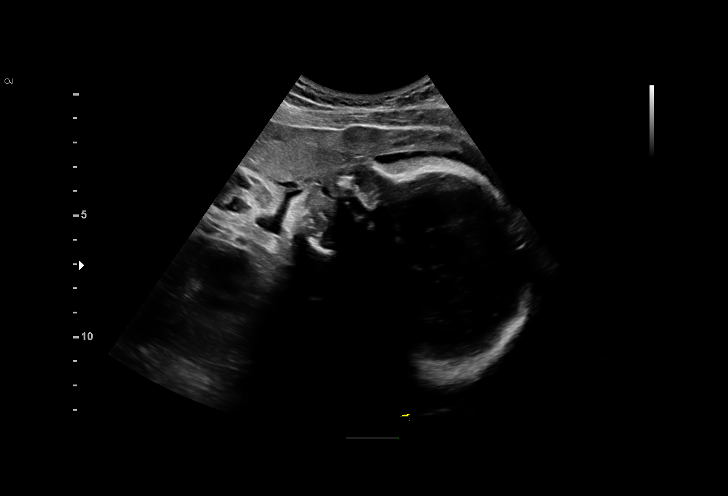
[im 3/23]
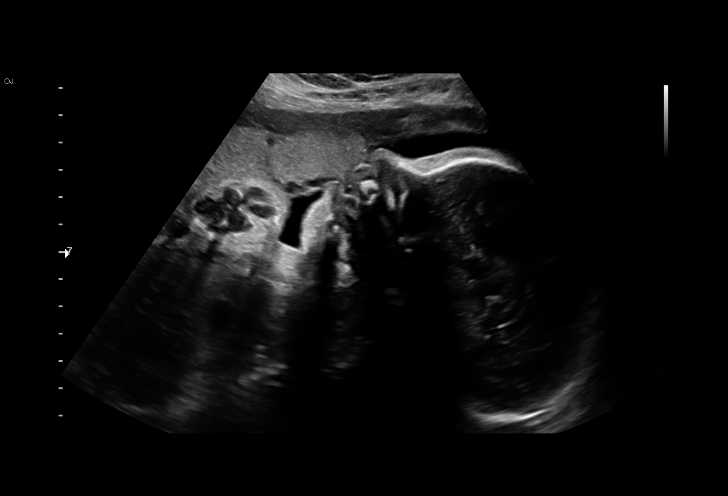
[im 4/23]
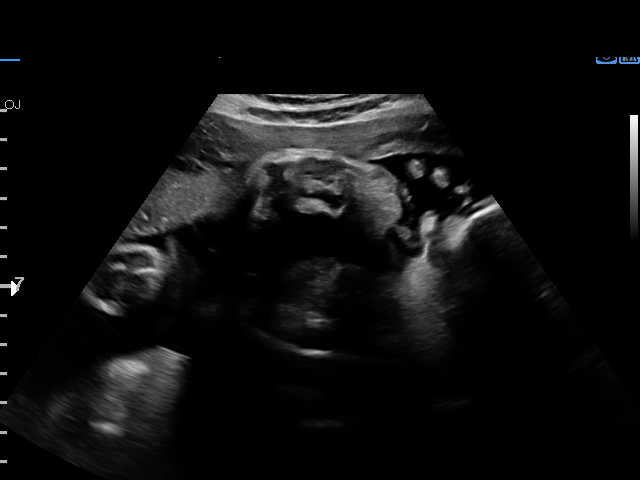
[im 6/23]
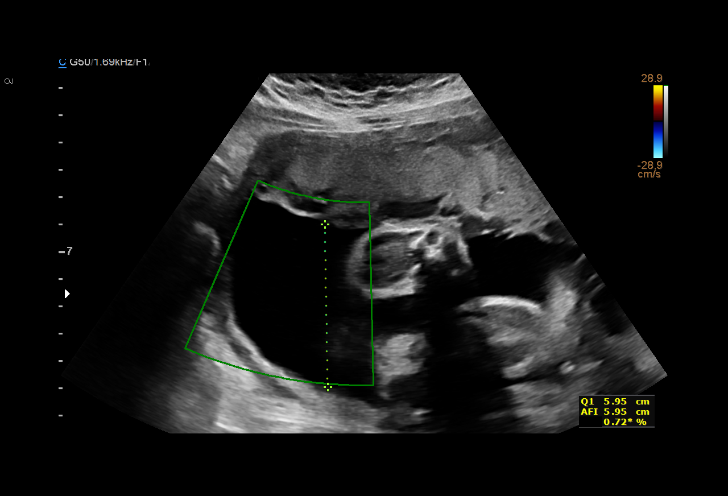
[im 7/23]
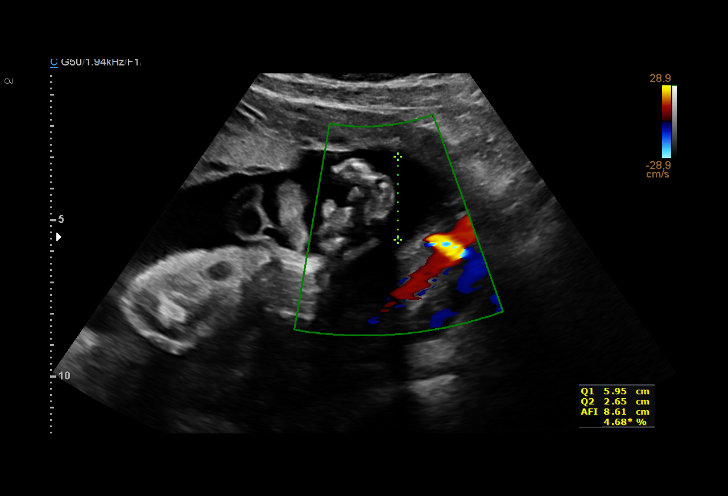
[im 8/23]
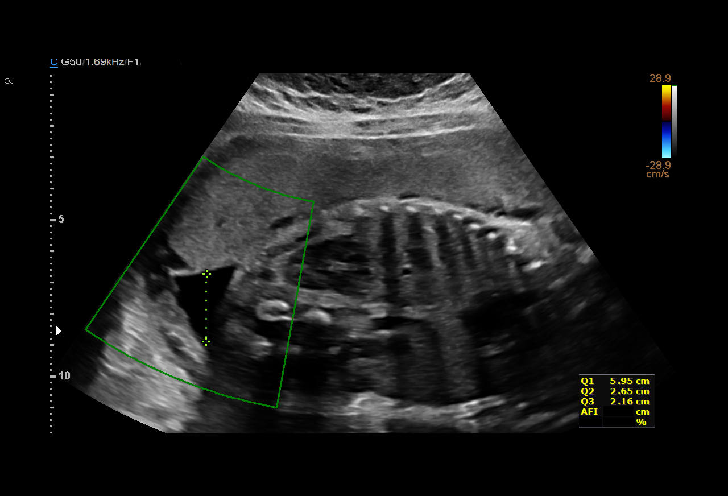
[im 10/23]
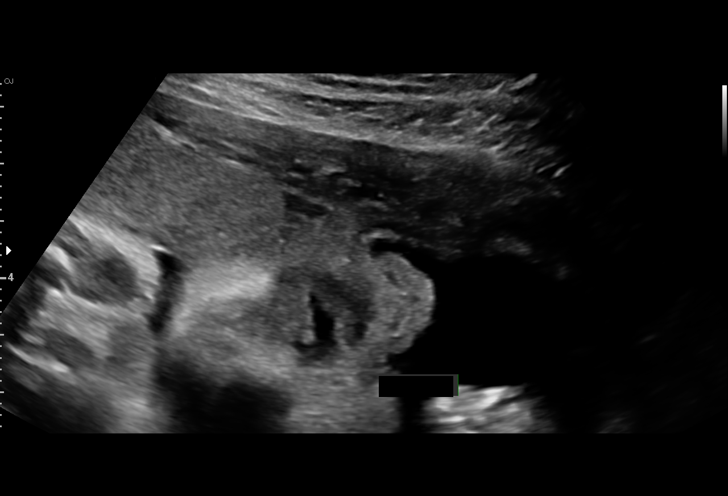
[im 11/23]
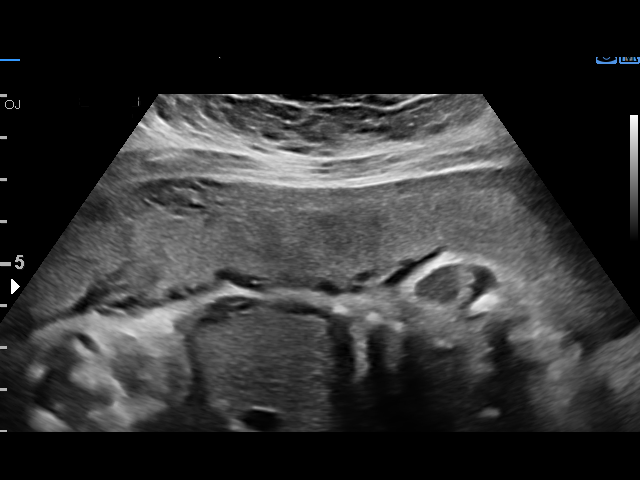
[im 13/23]
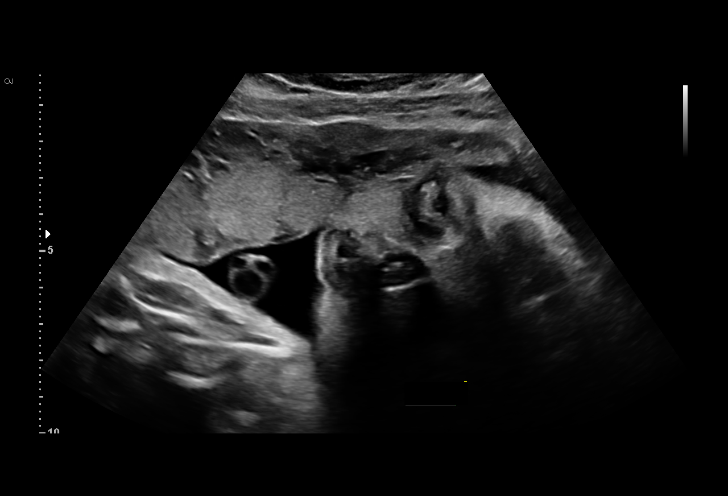
[im 14/23]
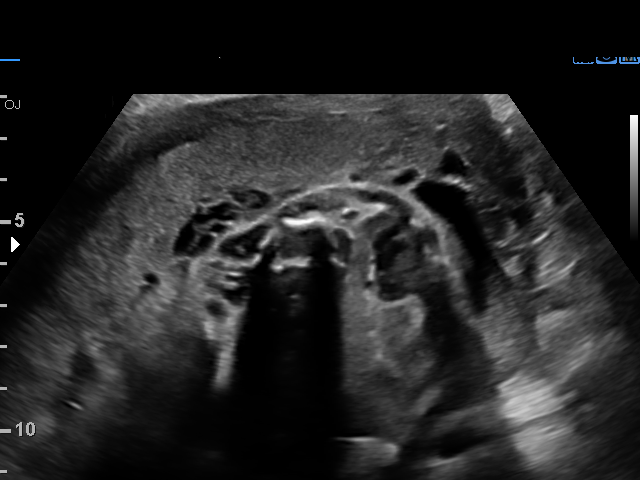
[im 16/23]
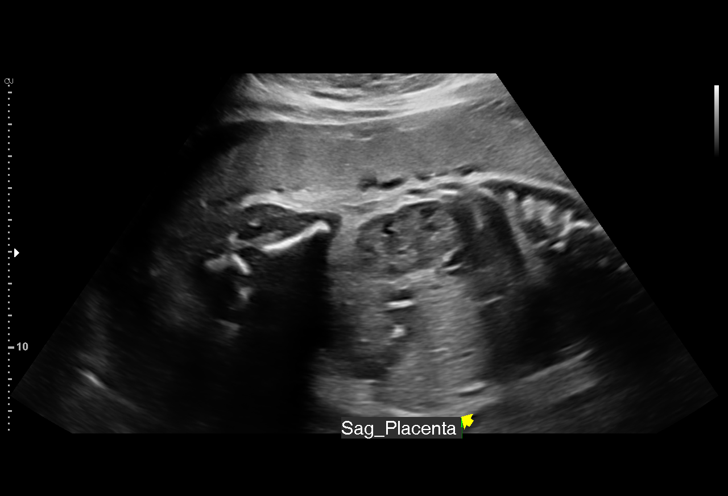
[im 17/23]
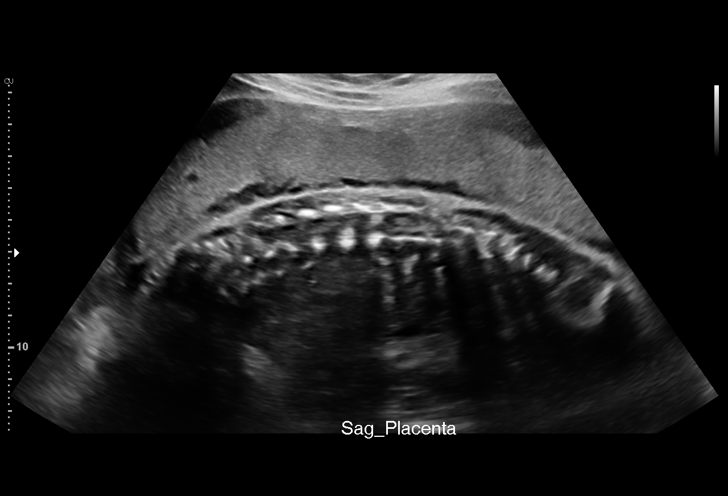
[im 18/23]
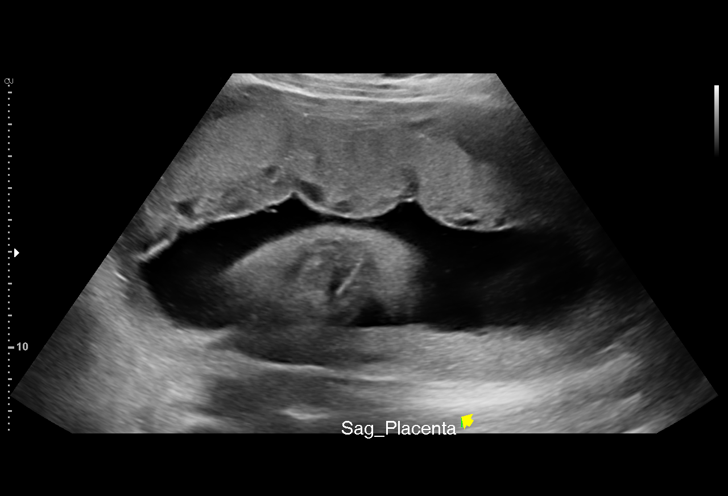
[im 20/23]
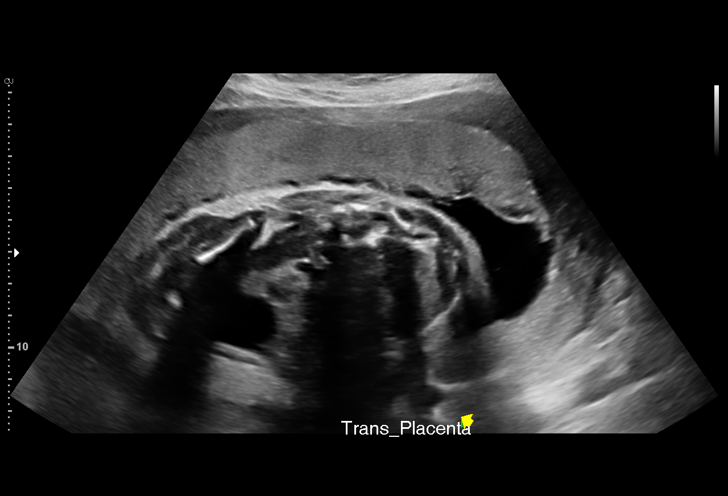
[im 21/23]
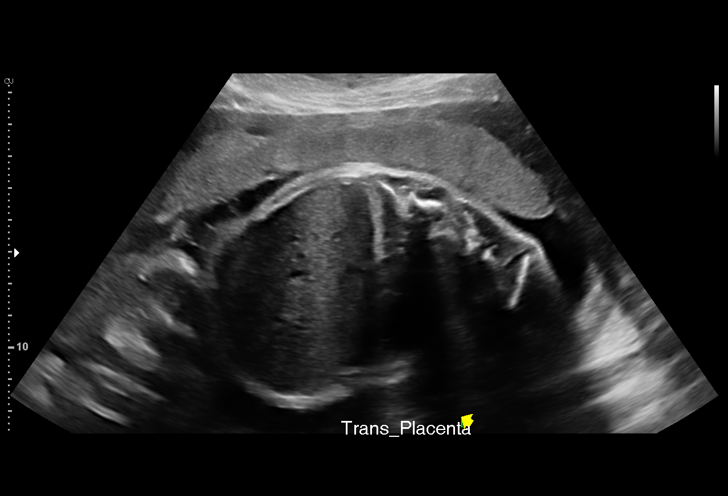
[im 23/23]
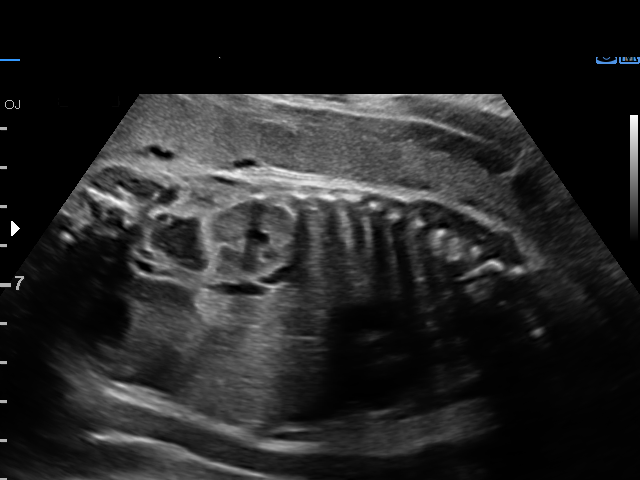

[16 of 23 positions shown; findings below may reference images not displayed]

[REDACTED]-
Faculty Physician
MAU/Triage

1  RT[facility] JOSHJAX            [id number]      [id number]     [id number]
Indications

31 weeks gestation of pregnancy
Non-reactive NST
Decreased fetal movement
Fetal Evaluation

Num Of Fetuses:     1
Fetal Heart         142
Rate(bpm):
Cardiac Activity:   Observed
Presentation:       Cephalic

Amniotic Fluid
AFI FV:      Subjectively within normal limits

AFI Sum(cm)     %Tile       Largest Pocket(cm)
14.58           51

RUQ(cm)       RLQ(cm)       LUQ(cm)        LLQ(cm)
5.95
Biophysical Evaluation

Amniotic F.V:   Within normal limits       F. Tone:        Observed
F. Movement:    Observed                   Score:          [DATE]
F. Breathing:   Not Observed
Gestational Age
LMP:           31w 1d        Date:  06/19/16                 EDD:   03/26/17
Best:          31w 1d     Det. By:  LMP  (06/19/16)          EDD:   03/26/17
Impression

SIUP at 31+1 weeks
Normal amniotic fluid volume
BPP [DATE] (-2 for no breathing movement)
Recommendations

If BPP remains [DATE], repeat within 24 hours

## 2019-07-24 IMAGING — CR DG CHEST 2V
2 series · 2 of 2 positions shown · non-contrast
Comparison: None.

CLINICAL DATA: Chest pain

EXAM:
CHEST - 2 VIEW

[chest pa]
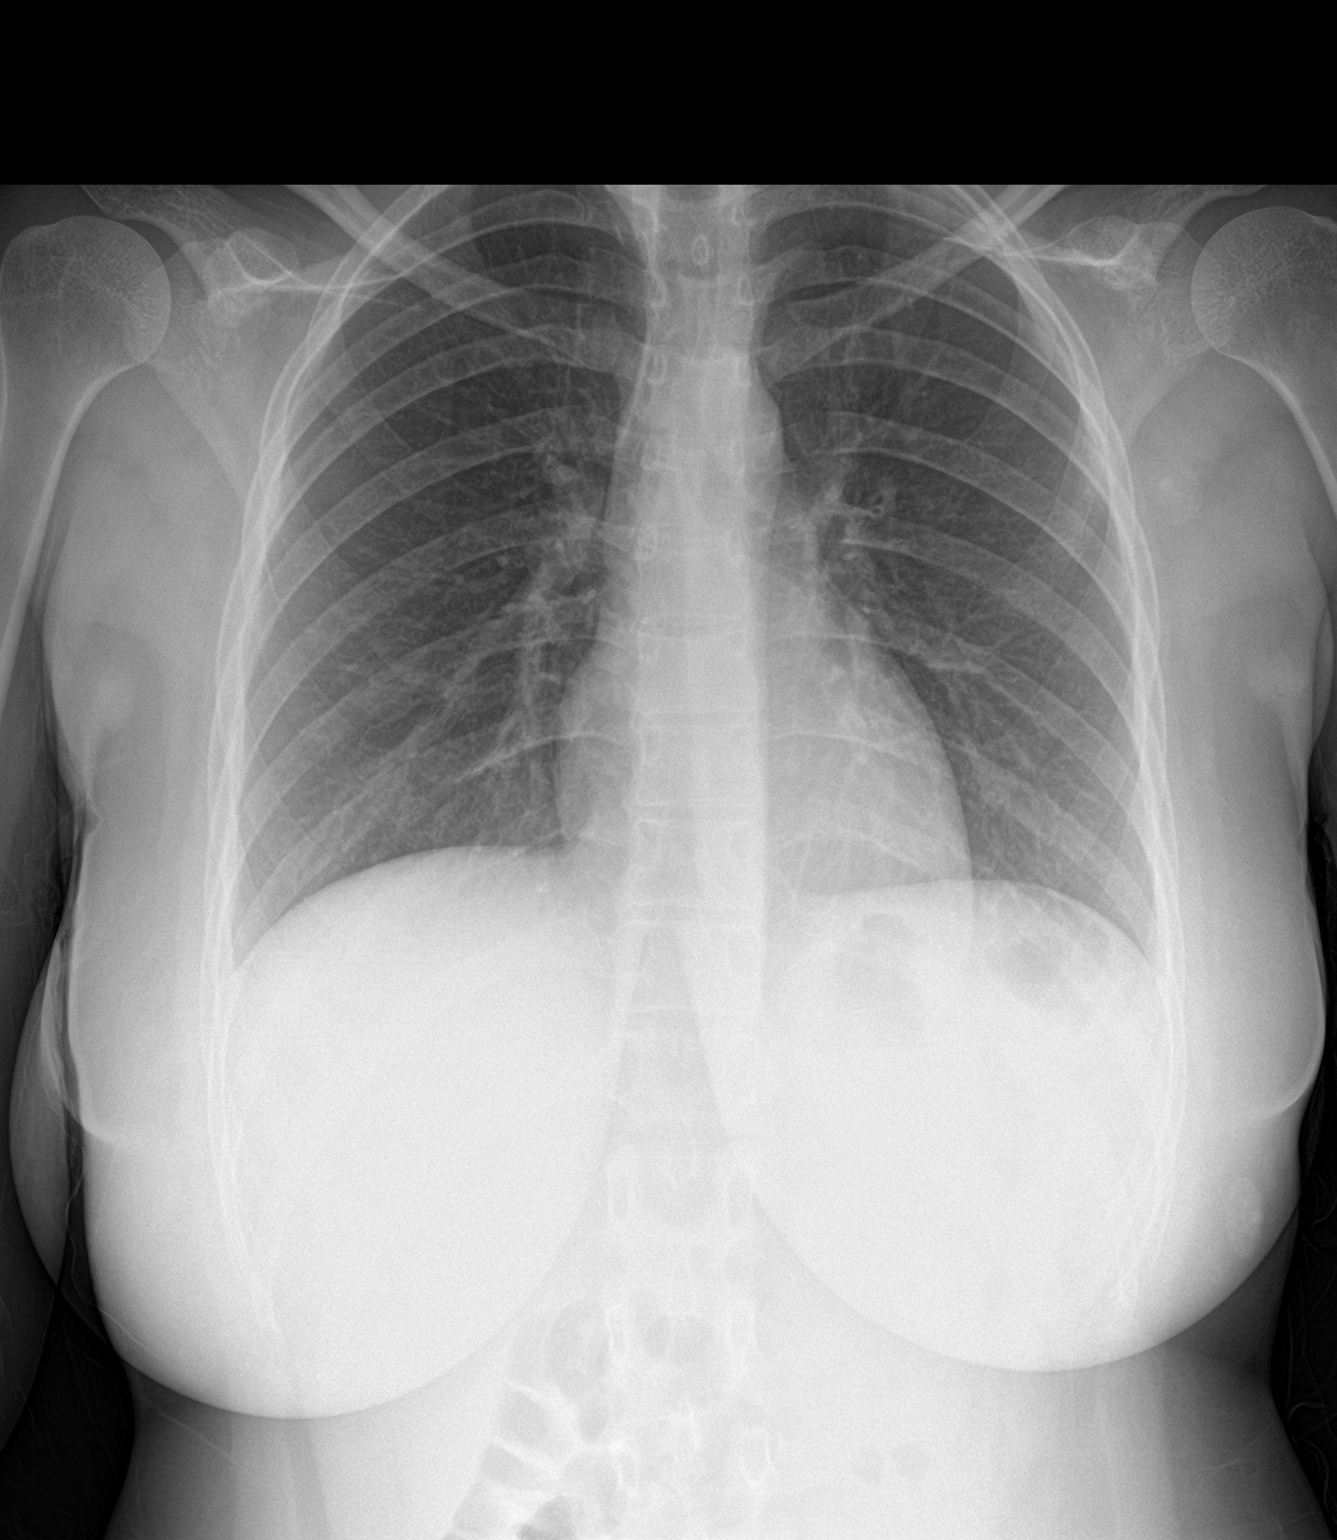

[chest lat]
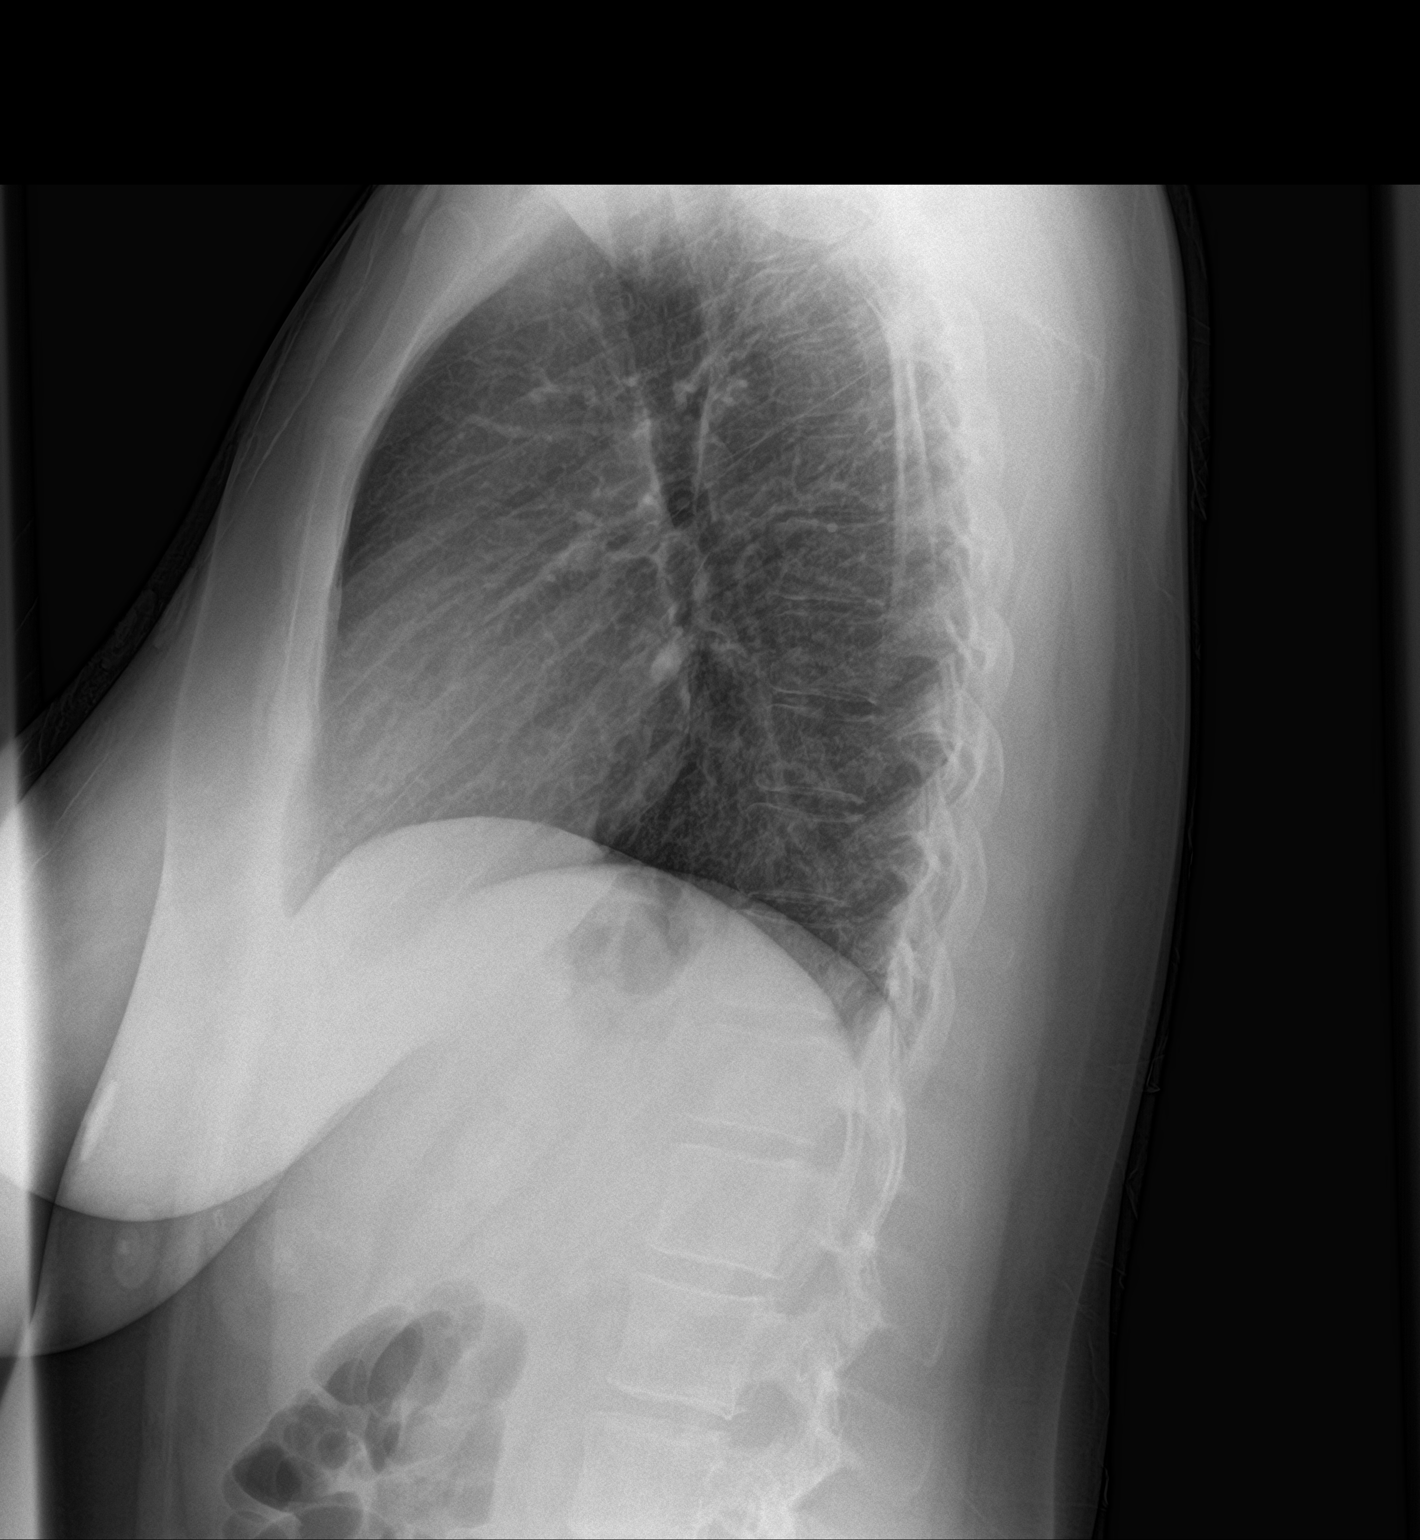

[2 of 2 positions shown; findings below may reference images not displayed]

FINDINGS: Lungs are clear. Heart size and pulmonary vascularity are normal. No
adenopathy. No pneumothorax. No bone lesions.
IMPRESSION: No edema or consolidation.

## 2022-09-15 ENCOUNTER — Ambulatory Visit (INDEPENDENT_AMBULATORY_CARE_PROVIDER_SITE_OTHER): Payer: 59 | Admitting: Nurse Practitioner

## 2022-09-15 ENCOUNTER — Ambulatory Visit: Payer: Self-pay | Admitting: Nurse Practitioner

## 2022-09-15 VITALS — BP 118/76 | HR 66 | Temp 97.9°F | Ht 63.0 in | Wt 138.5 lb

## 2022-09-15 DIAGNOSIS — Z131 Encounter for screening for diabetes mellitus: Secondary | ICD-10-CM

## 2022-09-15 DIAGNOSIS — Z0000 Encounter for general adult medical examination without abnormal findings: Secondary | ICD-10-CM

## 2022-09-15 DIAGNOSIS — Z0001 Encounter for general adult medical examination with abnormal findings: Secondary | ICD-10-CM

## 2022-09-15 DIAGNOSIS — Z113 Encounter for screening for infections with a predominantly sexual mode of transmission: Secondary | ICD-10-CM | POA: Insufficient documentation

## 2022-09-15 DIAGNOSIS — Z124 Encounter for screening for malignant neoplasm of cervix: Secondary | ICD-10-CM | POA: Insufficient documentation

## 2022-09-15 DIAGNOSIS — H5360 Unspecified night blindness: Secondary | ICD-10-CM

## 2022-09-15 DIAGNOSIS — Z1159 Encounter for screening for other viral diseases: Secondary | ICD-10-CM | POA: Insufficient documentation

## 2022-09-15 DIAGNOSIS — Z136 Encounter for screening for cardiovascular disorders: Secondary | ICD-10-CM | POA: Diagnosis not present

## 2022-09-15 LAB — HEMOGLOBIN A1C: Hgb A1c MFr Bld: 5.5 % (ref 4.6–6.5)

## 2022-09-15 LAB — CBC
HCT: 34.9 % — ABNORMAL LOW (ref 36.0–46.0)
Hemoglobin: 11 g/dL — ABNORMAL LOW (ref 12.0–15.0)
MCHC: 31.5 g/dL (ref 30.0–36.0)
MCV: 79 fl (ref 78.0–100.0)
Platelets: 353 10*3/uL (ref 150.0–400.0)
RBC: 4.42 Mil/uL (ref 3.87–5.11)
RDW: 19.2 % — ABNORMAL HIGH (ref 11.5–15.5)
WBC: 7.3 10*3/uL (ref 4.0–10.5)

## 2022-09-15 LAB — LIPID PANEL
Cholesterol: 200 mg/dL (ref 0–200)
HDL: 61.2 mg/dL (ref >39.00)
LDL Cholesterol: 125 mg/dL — ABNORMAL HIGH (ref 0–99)
NonHDL: 139.06
Total CHOL/HDL Ratio: 3
Triglycerides: 70 mg/dL (ref 0.0–149.0)
VLDL: 14 mg/dL (ref 0.0–40.0)

## 2022-09-15 LAB — COMPREHENSIVE METABOLIC PANEL
ALT: 7 U/L (ref 0–35)
AST: 13 U/L (ref 0–37)
Albumin: 4.6 g/dL (ref 3.5–5.2)
Alkaline Phosphatase: 61 U/L (ref 39–117)
BUN: 14 mg/dL (ref 6–23)
CO2: 26 mEq/L (ref 19–32)
Calcium: 9.4 mg/dL (ref 8.4–10.5)
Chloride: 104 mEq/L (ref 96–112)
Creatinine, Ser: 0.71 mg/dL (ref 0.40–1.20)
GFR: 112.59 mL/min (ref >60.00)
Glucose, Bld: 99 mg/dL (ref 70–99)
Potassium: 4.2 mEq/L (ref 3.5–5.1)
Sodium: 136 mEq/L (ref 135–145)
Total Bilirubin: 0.3 mg/dL (ref 0.2–1.2)
Total Protein: 8 g/dL (ref 6.0–8.3)

## 2022-09-15 LAB — TSH: TSH: 2.16 u[IU]/mL (ref 0.35–5.50)

## 2022-09-15 NOTE — Assessment & Plan Note (Signed)
Labs ordered, further recommendations may be made based upon these results. 

## 2022-09-15 NOTE — Progress Notes (Signed)
New Patient Office Visit  Subjective    Patient ID: Sue Mendoza, female    DOB: [birth year]  Age: 32 y.o. MRN: [id number]  CC: No chief complaint on file.   HPI Sue Mendoza presents to establish care She has not had a primary care provider, but establishes today now that she has medical insurance.  Her main concerns that she reports difficulty with night vision.  Her current job is at [employer] [place] and her hours are 12 PM to 9 PM, she feels it would be safer for her to have a job during daylight hours due to concerns with driving at night.  She does wear glasses. She has no other concerns today.  Outpatient Encounter Medications as of 09/15/2022  Medication Sig   [DISCONTINUED] calcium carbonate (TUMS - DOSED IN MG ELEMENTAL CALCIUM) 500 MG chewable tablet Chew 1-2 tablets by mouth daily.   [DISCONTINUED] diclofenac (VOLTAREN) 75 MG EC tablet Take 1 tablet (75 mg total) by mouth 2 (two) times daily.   [DISCONTINUED] ibuprofen (ADVIL,MOTRIN) 600 MG tablet Take 1 tablet (600 mg total) by mouth every 6 (six) hours.   [DISCONTINUED] Prenatal Vit-Fe Fumarate-FA (PRENATAL MULTIVITAMIN) TABS tablet Take 1 tablet by mouth daily.   No facility-administered encounter medications on file as of 09/15/2022.    Past Medical History:  Diagnosis Date   Medical history non-contributory     Past Surgical History:  Procedure Laterality Date   NO PAST SURGERIES      Family History  Problem Relation Age of Onset   Hypertension Mother    Diabetes Maternal Grandmother     Social History   Socioeconomic History   Marital status: Single    Spouse name: Not on file   Number of children: Not on file   Years of education: Not on file   Highest education level: Not on file  Occupational History   Not on file  Tobacco Use   Smoking status: Former   Smokeless tobacco: Never  Substance and Sexual Activity   Alcohol use: No    Comment: rare   Drug use: No   Sexual activity: Yes     Birth control/protection: None  Other Topics Concern   Not on file  Social History Narrative   Not on file   Social Determinants of Health   Financial Resource Strain: Not on file  Food Insecurity: Not on file  Transportation Needs: Not on file  Physical Activity: Not on file  Stress: Not on file  Social Connections: Not on file  Intimate Partner Violence: Not on file    Review of Systems  Eyes:  Positive for blurred vision.  Respiratory:  Negative for shortness of breath.   Cardiovascular:  Negative for chest pain and palpitations.        Objective    BP 118/76   Pulse 66   Temp 97.9 F (36.6 C) (Temporal)   Ht 5\' 3"  (1.6 m)   Wt 138 lb 8 oz (62.8 kg)   LMP 09/07/2022   SpO2 100%   BMI 24.53 kg/m   Physical Exam Vitals reviewed.  Constitutional:      General: She is not in acute distress.    Appearance: Normal appearance.  HENT:     Head: Normocephalic and atraumatic.  Neck:     Vascular: No carotid bruit.  Cardiovascular:     Rate and Rhythm: Normal rate and regular rhythm.     Pulses: Normal pulses.     Heart sounds:  Normal heart sounds.  Pulmonary:     Effort: Pulmonary effort is normal.     Breath sounds: Normal breath sounds.  Skin:    General: Skin is warm and dry.  Neurological:     General: No focal deficit present.     Mental Status: She is alert and oriented to person, place, and time.  Psychiatric:        Mood and Affect: Mood normal.        Behavior: Behavior normal.        Judgment: Judgment normal.         Assessment & Plan:   Problem List Items Addressed This Visit       Other   Encounter for general adult medical examination with abnormal findings - Primary    Labs ordered, further recommendations may be made based upon these results       Relevant Orders   CBC   Comprehensive metabolic panel   Hemoglobin A1c   Lipid panel   TSH   Diabetes mellitus screening    Labs ordered, further recommendations may be made  based upon these results       Relevant Orders   CBC   Comprehensive metabolic panel   Hemoglobin A1c   Lipid panel   TSH   Night vision loss    Referral to ophthalmology made today for further evaluation of night vision difficulties      Relevant Orders   Ambulatory referral to Ophthalmology   Encounter for hepatitis C screening test for low risk patient    Labs ordered, further recommendations may be made based upon these results       Relevant Orders   Hepatitis C antibody   Routine screening for STI (sexually transmitted infection)    Labs ordered, further recommendations may be made based upon these results       Relevant Orders   HIV Antibody (routine testing w rflx)   RPR   Chlamydia/Neisseria Gonorrhoeae RNA,TMA,Urogenital   Cervical cancer screening    Referral to OBGYN made today      Relevant Orders   Ambulatory referral to Obstetrics / Gynecology    Return in about 3 months (around 12/16/2022) for 1-3 months CPE with Maralyn Sago.   Elenore Paddy, NP

## 2022-09-15 NOTE — Assessment & Plan Note (Signed)
Referral to ophthalmology made today for further evaluation of night vision difficulties

## 2022-09-15 NOTE — Assessment & Plan Note (Signed)
Referral to OBGYN made today.  

## 2022-09-17 LAB — CHLAMYDIA/NEISSERIA GONORRHOEAE RNA,TMA,UROGENTIAL
C. trachomatis RNA, TMA: NOT DETECTED
N. gonorrhoeae RNA, TMA: NOT DETECTED

## 2022-09-19 LAB — RPR: RPR Ser Ql: NONREACTIVE

## 2022-09-19 LAB — HEPATITIS C ANTIBODY: Hepatitis C Ab: NONREACTIVE

## 2022-09-19 LAB — HIV ANTIBODY (ROUTINE TESTING W REFLEX): HIV 1&2 Ab, 4th Generation: NONREACTIVE

## 2022-12-21 ENCOUNTER — Ambulatory Visit: Payer: 59 | Admitting: Nurse Practitioner

## 2023-04-16 ENCOUNTER — Encounter: Payer: 59 | Admitting: Obstetrics & Gynecology

## 2023-07-18 ENCOUNTER — Emergency Department (HOSPITAL_COMMUNITY)

## 2023-07-18 ENCOUNTER — Encounter (HOSPITAL_COMMUNITY): Payer: Self-pay

## 2023-07-18 ENCOUNTER — Emergency Department (HOSPITAL_COMMUNITY)
Admission: EM | Admit: 2023-07-18 | Discharge: 2023-07-18 | Disposition: A | Discharge status: Home / Self Care | Attending: Emergency Medicine | Admitting: Emergency Medicine

## 2023-07-18 ENCOUNTER — Other Ambulatory Visit: Payer: Self-pay

## 2023-07-18 DIAGNOSIS — R1012 Left upper quadrant pain: Secondary | ICD-10-CM | POA: Insufficient documentation

## 2023-07-18 DIAGNOSIS — Y92410 Unspecified street and highway as the place of occurrence of the external cause: Secondary | ICD-10-CM | POA: Diagnosis not present

## 2023-07-18 DIAGNOSIS — M25512 Pain in left shoulder: Secondary | ICD-10-CM | POA: Diagnosis present

## 2023-07-18 LAB — I-STAT CHEM 8, ED
BUN: 17 mg/dL (ref 6–20)
Calcium, Ion: 1.09 mmol/L — ABNORMAL LOW (ref 1.15–1.40)
Chloride: 108 mmol/L (ref 98–111)
Creatinine, Ser: 0.6 mg/dL (ref 0.44–1.00)
Glucose, Bld: 91 mg/dL (ref 70–99)
HCT: 39 % (ref 36.0–46.0)
Hemoglobin: 13.3 g/dL (ref 12.0–15.0)
Potassium: 4.1 mmol/L (ref 3.5–5.1)
Sodium: 138 mmol/L (ref 135–145)
TCO2: 22 mmol/L (ref 22–32)

## 2023-07-18 LAB — CBC WITH DIFFERENTIAL/PLATELET
Abs Immature Granulocytes: 0.05 10*3/uL (ref 0.00–0.07)
Basophils Absolute: 0 10*3/uL (ref 0.0–0.1)
Basophils Relative: 0 %
Eosinophils Absolute: 0.2 10*3/uL (ref 0.0–0.5)
Eosinophils Relative: 2 %
HCT: 39 % (ref 36.0–46.0)
Hemoglobin: 12.7 g/dL (ref 12.0–15.0)
Immature Granulocytes: 1 %
Lymphocytes Relative: 21 %
Lymphs Abs: 2.1 10*3/uL (ref 0.7–4.0)
MCH: 28 pg (ref 26.0–34.0)
MCHC: 32.6 g/dL (ref 30.0–36.0)
MCV: 86.1 fL (ref 80.0–100.0)
Monocytes Absolute: 0.6 10*3/uL (ref 0.1–1.0)
Monocytes Relative: 6 %
Neutro Abs: 7 10*3/uL (ref 1.7–7.7)
Neutrophils Relative %: 70 %
Platelets: 362 10*3/uL (ref 150–400)
RBC: 4.53 MIL/uL (ref 3.87–5.11)
RDW: 16.3 % — ABNORMAL HIGH (ref 11.5–15.5)
WBC: 9.9 10*3/uL (ref 4.0–10.5)
nRBC: 0 % (ref 0.0–0.2)

## 2023-07-18 LAB — COMPREHENSIVE METABOLIC PANEL WITH GFR
ALT: 11 U/L (ref 0–44)
AST: 15 U/L (ref 15–41)
Albumin: 4.2 g/dL (ref 3.5–5.0)
Alkaline Phosphatase: 78 U/L (ref 38–126)
Anion gap: 7 (ref 5–15)
BUN: 19 mg/dL (ref 6–20)
CO2: 21 mmol/L — ABNORMAL LOW (ref 22–32)
Calcium: 8.9 mg/dL (ref 8.9–10.3)
Chloride: 107 mmol/L (ref 98–111)
Creatinine, Ser: 0.52 mg/dL (ref 0.44–1.00)
GFR, Estimated: >60 mL/min (ref >60)
Glucose, Bld: 96 mg/dL (ref 70–99)
Potassium: 4 mmol/L (ref 3.5–5.1)
Sodium: 135 mmol/L (ref 135–145)
Total Bilirubin: 0.5 mg/dL (ref 0.0–1.2)
Total Protein: 8.3 g/dL — ABNORMAL HIGH (ref 6.5–8.1)

## 2023-07-18 LAB — HCG, SERUM, QUALITATIVE: Preg, Serum: NEGATIVE

## 2023-07-18 MED ORDER — SODIUM CHLORIDE (PF) 0.9 % IJ SOLN
INTRAMUSCULAR | Status: AC
  Filled 2023-07-18: qty 50

## 2023-07-18 MED ORDER — IOHEXOL 300 MG/ML  SOLN
100.0000 mL | Freq: Once | INTRAMUSCULAR | Status: AC | PRN
  Administered 2023-07-18: 100 mL via INTRAVENOUS

## 2023-07-18 MED ORDER — OXYCODONE-ACETAMINOPHEN 5-325 MG PO TABS
1.0000 | ORAL_TABLET | Freq: Once | ORAL | Status: AC
  Administered 2023-07-18: 1 via ORAL
  Filled 2023-07-18: qty 1
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
## 2023-07-18 NOTE — Discharge Instructions (Addendum)
 It was a pleasure caring for you today. Please follow up with primary care. Seek emergency care if experiencing any new or worsening symptoms.   Alternating between 650 mg Tylenol  and 400 mg Advil : The best way to alternate taking Acetaminophen  (example Tylenol ) and Ibuprofen  (example Advil /Motrin ) is to take them 3 hours apart. For example, if you take ibuprofen  at 6 am you can then take Tylenol  at 9 am. You can continue this regimen throughout the day, making sure you do not exceed the recommended maximum dose for each drug.
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
